# Patient Record
Sex: Male | Born: 1965 | Race: White | Hispanic: No | Marital: Married | Smoking: Never smoker
Health system: Southern US, Community
[De-identification: ages and names within clinical notes are randomized; demographics above are authoritative.]

## PROBLEM LIST (undated history)

## (undated) DIAGNOSIS — G4730 Sleep apnea, unspecified: Secondary | ICD-10-CM

## (undated) DIAGNOSIS — J45909 Unspecified asthma, uncomplicated: Secondary | ICD-10-CM

## (undated) DIAGNOSIS — N189 Chronic kidney disease, unspecified: Secondary | ICD-10-CM

## (undated) DIAGNOSIS — I10 Essential (primary) hypertension: Secondary | ICD-10-CM

## (undated) DIAGNOSIS — F419 Anxiety disorder, unspecified: Secondary | ICD-10-CM

## (undated) DIAGNOSIS — K219 Gastro-esophageal reflux disease without esophagitis: Secondary | ICD-10-CM

## (undated) DIAGNOSIS — T7840XA Allergy, unspecified, initial encounter: Secondary | ICD-10-CM

## (undated) DIAGNOSIS — M5412 Radiculopathy, cervical region: Principal | ICD-10-CM

## (undated) DIAGNOSIS — Z87442 Personal history of urinary calculi: Secondary | ICD-10-CM

## (undated) DIAGNOSIS — E119 Type 2 diabetes mellitus without complications: Secondary | ICD-10-CM

## (undated) DIAGNOSIS — F32A Depression, unspecified: Secondary | ICD-10-CM

## (undated) DIAGNOSIS — E785 Hyperlipidemia, unspecified: Secondary | ICD-10-CM

## (undated) DIAGNOSIS — F329 Major depressive disorder, single episode, unspecified: Secondary | ICD-10-CM

## (undated) DIAGNOSIS — G8929 Other chronic pain: Principal | ICD-10-CM

## (undated) HISTORY — DX: Anxiety disorder, unspecified: F41.9

## (undated) HISTORY — DX: Radiculopathy, cervical region: M54.12

## (undated) HISTORY — DX: Essential (primary) hypertension: I10

## (undated) HISTORY — DX: Allergy, unspecified, initial encounter: T78.40XA

## (undated) HISTORY — DX: Depression, unspecified: F32.A

## (undated) HISTORY — DX: Hyperlipidemia, unspecified: E78.5

## (undated) HISTORY — PX: STONE EXTRACTION WITH BASKET: SHX5318

## (undated) HISTORY — DX: Other chronic pain: G89.29

## (undated) HISTORY — DX: Chronic kidney disease, unspecified: N18.9

## (undated) HISTORY — PX: TESTICLE SURGERY: SHX794

## (undated) HISTORY — DX: Major depressive disorder, single episode, unspecified: F32.9

---

## 2011-08-28 DIAGNOSIS — R072 Precordial pain: Secondary | ICD-10-CM

## 2015-04-23 HISTORY — PX: COLONOSCOPY: SHX174

## 2015-11-22 ENCOUNTER — Emergency Department (HOSPITAL_COMMUNITY)
Admission: EM | Admit: 2015-11-22 | Discharge: 2015-11-22 | Disposition: A | Discharge status: Home / Self Care | Payer: BLUE CROSS/BLUE SHIELD | Attending: Emergency Medicine | Admitting: Emergency Medicine

## 2015-11-22 ENCOUNTER — Encounter (HOSPITAL_COMMUNITY): Payer: Self-pay | Admitting: *Deleted

## 2015-11-22 DIAGNOSIS — R109 Unspecified abdominal pain: Secondary | ICD-10-CM

## 2015-11-22 DIAGNOSIS — E119 Type 2 diabetes mellitus without complications: Secondary | ICD-10-CM | POA: Diagnosis not present

## 2015-11-22 DIAGNOSIS — N390 Urinary tract infection, site not specified: Secondary | ICD-10-CM

## 2015-11-22 DIAGNOSIS — R319 Hematuria, unspecified: Secondary | ICD-10-CM | POA: Insufficient documentation

## 2015-11-22 HISTORY — DX: Type 2 diabetes mellitus without complications: E11.9

## 2015-11-22 LAB — CBC
HEMATOCRIT: 43.9 % (ref 39.0–52.0)
HEMOGLOBIN: 15 g/dL (ref 13.0–17.0)
MCH: 29.1 pg (ref 26.0–34.0)
MCHC: 34.2 g/dL (ref 30.0–36.0)
MCV: 85.2 fL (ref 78.0–100.0)
Platelets: 251 10*3/uL (ref 150–400)
RBC: 5.15 MIL/uL (ref 4.22–5.81)
RDW: 12.7 % (ref 11.5–15.5)
WBC: 9 10*3/uL (ref 4.0–10.5)

## 2015-11-22 LAB — COMPREHENSIVE METABOLIC PANEL
ALK PHOS: 51 U/L (ref 38–126)
ALT: 81 U/L — AB (ref 17–63)
AST: 53 U/L — AB (ref 15–41)
Albumin: 4.4 g/dL (ref 3.5–5.0)
Anion gap: 10 (ref 5–15)
BILIRUBIN TOTAL: 0.6 mg/dL (ref 0.3–1.2)
BUN: 15 mg/dL (ref 6–20)
CALCIUM: 9.9 mg/dL (ref 8.9–10.3)
CO2: 23 mmol/L (ref 22–32)
CREATININE: 1.05 mg/dL (ref 0.61–1.24)
Chloride: 103 mmol/L (ref 101–111)
GFR calc Af Amer: >60 mL/min (ref >60)
GLUCOSE: 156 mg/dL — AB (ref 65–99)
POTASSIUM: 4.3 mmol/L (ref 3.5–5.1)
SODIUM: 136 mmol/L (ref 135–145)
TOTAL PROTEIN: 7.8 g/dL (ref 6.5–8.1)

## 2015-11-22 LAB — URINE MICROSCOPIC-ADD ON

## 2015-11-22 LAB — URINALYSIS, ROUTINE W REFLEX MICROSCOPIC
GLUCOSE, UA: NEGATIVE mg/dL
Ketones, ur: 15 mg/dL — AB
Nitrite: POSITIVE — AB
PROTEIN: 100 mg/dL — AB
Specific Gravity, Urine: 1.026 (ref 1.005–1.030)
pH: 5.5 (ref 5.0–8.0)

## 2015-11-22 LAB — I-STAT TROPONIN, ED: Troponin i, poc: 0.01 ng/mL (ref 0.00–0.08)

## 2015-11-22 MED ORDER — CEPHALEXIN 500 MG PO CAPS: 500.0000 mg | ORAL_CAPSULE | Freq: Four times a day (QID) | ORAL | 0 refills | Status: DC

## 2015-11-22 NOTE — ED Triage Notes (Addendum)
Pt reports having episode of abd pain this am with diaphoresis and near syncope. The pain radiated down into groin and testicle. Denies cp or sob. His abd pain was relieved pta. Hx of kidney stones.

## 2015-11-22 NOTE — ED Notes (Signed)
Patient verbalized understanding of discharge instructions and denies any further needs or questions at this time. VS stable. Patient ambulatory with steady gait.  

## 2015-11-22 NOTE — ED Notes (Signed)
Updated patient on delay - spoke with MD - to come reassess soon.

## 2015-11-22 NOTE — Discharge Instructions (Signed)
Syncope, commonly known as fainting, is a temporary loss of consciousness. It occurs when the blood flow to the brain is reduced. Vasovagal syncope (also called neurocardiogenic syncope) is a fainting spell in which the blood flow to the brain is reduced because of a sudden drop in heart rate and blood pressure. Vasovagal syncope occurs when the brain and the cardiovascular system (blood vessels) do not adequately communicate and respond to each other. This is the most common cause of fainting. It often occurs in response to fear or some other type of emotional or physical stress. The body has a reaction in which the heart starts beating too slowly or the blood vessels expand, reducing blood pressure. This type of fainting spell is generally considered harmless. However, injuries can occur if a person takes a sudden fall during a fainting spell.   CAUSES   Vasovagal syncope occurs when a person's blood pressure and heart rate decrease suddenly, usually in response to a trigger. Many things and situations can trigger an episode. Some of these include:    Pain.    Fear.    The sight of blood or medical procedures, such as blood being drawn from a vein.    Common activities, such as coughing, swallowing, stretching, or going to the bathroom.    Emotional stress.    Prolonged standing, especially in a warm environment.    Lack of sleep or rest.    Prolonged lack of food.    Prolonged lack of fluids.    Recent illness.   The use of certain drugs that affect blood pressure, such as cocaine, alcohol, marijuana, inhalants, and opiates.   SYMPTOMS   Before the fainting episode, you may:    Feel dizzy or light headed.    Become pale.   Sense that you are going to faint.    Feel like the room is spinning.    Have tunnel vision, only seeing directly in front of you.    Feel sick to your stomach (nauseous).    See spots or slowly lose vision.    Hear ringing in your ears.    Have a headache.     Feel warm and sweaty.    Feel a sensation of pins and needles.  During the fainting spell, you will generally be unconscious for no longer than a couple minutes before waking up and returning to normal. If you get up too quickly before your body can recover, you may faint again. Some twitching or jerky movements may occur during the fainting spell.   DIAGNOSIS   Your health care provider will ask about your symptoms, take a medical history, and perform a physical exam. Various tests may be done to rule out other causes of fainting. These may include blood tests and tests to check the heart, such as electrocardiography, echocardiography, and possibly an electrophysiology study. When other causes have been ruled out, a test may be done to check the body's response to changes in position (tilt table test).  TREATMENT   Most cases of vasovagal syncope do not require treatment. Your health care provider may recommend ways to avoid fainting triggers and may provide home strategies for preventing fainting. If you must be exposed to a possible trigger, you can drink additional fluids to help reduce your chances of having an episode of vasovagal syncope. If you have warning signs of an oncoming episode, you can respond by positioning yourself favorably (lying down).  If your fainting spells continue, you may be   given medicines to prevent fainting. Some medicines may help make you more resistant to repeated episodes of vasovagal syncope. Special exercises or compression stockings may be recommended. In rare cases, the surgical placement of a pacemaker is considered.  HOME CARE INSTRUCTIONS    Learn to identify the warning signs of vasovagal syncope.    Sit or lie down at the first warning sign of a fainting spell. If sitting, put your head down between your legs. If you lie down, swing your legs up in the air to increase blood flow to the brain.    Avoid hot tubs and saunas.   Avoid prolonged standing.   Drink  enough fluids to keep your urine clear or pale yellow. Avoid caffeine.   Increase salt in your diet as directed by your health care provider.    If you have to stand for a long time, perform movements such as:     Crossing your legs.     Flexing and stretching your leg muscles.     Squatting.     Moving your legs.     Bending over.    Only take over-the-counter or prescription medicines as directed by your health care provider. Do not suddenly stop any medicines without asking your health care provider first.  SEEK MEDICAL CARE IF:    Your fainting spells continue or happen more frequently in spite of treatment.    You lose consciousness for more than a couple minutes.   You have fainting spells during or after exercising or after being startled.    You have new symptoms that occur with the fainting spells, such as:     Shortness of breath.    Chest pain.     Irregular heartbeat.    You have episodes of twitching or jerky movements that last longer than a few seconds.   You have episodes of twitching or jerky movements without obvious fainting.  SEEK IMMEDIATE MEDICAL CARE IF:    You have injuries or bleeding after a fainting spell.    You have episodes of twitching or jerky movements that last longer than 5 minutes.    You have more than one spell of twitching or jerky movements before returning to consciousness after fainting.     This information is not intended to replace advice given to you by your health care provider. Make sure you discuss any questions you have with your health care provider.     Document Released: 03/25/2012 Document Revised: 08/23/2014 Document Reviewed: 03/25/2012  Elsevier Interactive Patient Education 2016 [employer]

## 2015-11-22 NOTE — ED Provider Notes (Signed)
[facility] DEPT Provider Note   CSN: [id number] Arrival date & time: 11/22/15  1544  First Provider Contact:  None       History   Chief Complaint Chief Complaint  Patient presents with  . Near Syncope    HPI Adrian Jacobson is a 50 y.o. male.with sudden onset of left flank pain this am became nausearted then light heated with diaphoresis and became lightheaded.  Recnelty told abnormalities on stresss test.  No chest pain.  Pain in left flankl 1030-2 stopped with aspirin and zantac.  Noted blood in urine after arrival here.   HPI  Past Medical History:  Diagnosis Date  . Diabetes mellitus without complication ([place])     There are no active problems to display for this patient.   History reviewed. No pertinent surgical history.     Home Medications    Prior to Admission medications   Not on File    Family History History reviewed. No pertinent family history.  Social History Social History  Substance Use Topics  . Smoking status: Never Smoker  . Smokeless tobacco: Not on file  . Alcohol use No     Allergies   Hydrocodone and Niacin and related   Review of Systems Review of Systems  All other systems reviewed and are negative.    Physical Exam Updated Vital Signs BP 115/82   Pulse 94   Temp 98.5 F (36.9 C) (Oral)   Resp 10   SpO2 98%   Physical Exam  Constitutional: He is oriented to person, place, and time. He appears well-developed.  HENT:  Head: Normocephalic and atraumatic.  Right Ear: External ear normal.  Left Ear: External ear normal.  Nose: Nose normal.  Eyes: EOM are normal.  Neck: No tracheal deviation present.  Cardiovascular: Normal rate.   Pulmonary/Chest: Effort normal and breath sounds normal.  Abdominal: Soft. Bowel sounds are normal.  Genitourinary:  Genitourinary Comments: No flank pain  Musculoskeletal: Normal range of motion.  Neurological: He is alert and oriented to person, place, and time.  Skin: Skin is  warm and dry.  Psychiatric: He has a normal mood and affect. His behavior is normal.  Nursing note and vitals reviewed.    ED Treatments / Results  Labs (all labs ordered are listed, but only abnormal results are displayed) Labs Reviewed  COMPREHENSIVE METABOLIC PANEL - Abnormal; Notable for the following:       Result Value   Glucose, Bld 156 (*)    AST 53 (*)    ALT 81 (*)    All other components within normal limits  URINALYSIS, ROUTINE W REFLEX MICROSCOPIC (NOT AT [facility]) - Abnormal; Notable for the following:    Color, Urine RED (*)    APPearance TURBID (*)    Hgb urine dipstick LARGE (*)    Bilirubin Urine LARGE (*)    Ketones, ur 15 (*)    Protein, ur 100 (*)    Nitrite POSITIVE (*)    Leukocytes, UA MODERATE (*)    All other components within normal limits  URINE MICROSCOPIC-ADD ON - Abnormal; Notable for the following:    Squamous Epithelial / LPF 0-5 (*)    Bacteria, UA MANY (*)    All other components within normal limits  CBC  I-STAT TROPOININ, ED    EKG  EKG Interpretation  Date/Time:  Wednesday November 22 2015 16:05:38 EDT Ventricular Rate:  91 PR Interval:  144 QRS Duration: 96 QT Interval:  346 QTC Calculation: 425 R  Axis:   49 Text Interpretation:  Normal sinus rhythm with sinus arrhythmia Normal ECG Confirmed by Kongmeng Santoro MD, Andee Poles [phone]) on 11/22/2015 8:33:19 PM       Radiology No results found.  Procedures Procedures (including critical care time)  Medications Ordered in ED Medications - No data to display   Initial Impression / Assessment and Plan / ED Course  I have reviewed the triage vital signs and the nursing notes.  Pertinent labs & imaging results that were available during my care of the patient were reviewed by me and considered in my medical decision making (see chart for details).  Clinical Course    Patient had left flank pain followed by near syncope after the onset of pain. He has a history of kidney stones and evaluation  here is consistent with kidney stones with hematuria noted. He also has moderate leukocytes and is nitrite positive and has many bacteria. He is treated here with Keflex. He is pain-free. He has had recent evaluation for chest pain and has follow-up arranged with cardiology. EKG here is normal and history is consistent with vagal episode. Patient advised regarding need for follow-up and return precautions and voices understanding. Final Clinical Impressions(s) / ED Diagnoses   Final diagnoses:  Left flank pain  Hematuria  UTI (lower urinary tract infection)    New Prescriptions New Prescriptions   No medications on file     Pattricia Boss, MD 11/23/15 0010

## 2015-11-24 ENCOUNTER — Encounter (HOSPITAL_COMMUNITY): Payer: Self-pay | Admitting: Emergency Medicine

## 2015-11-24 ENCOUNTER — Emergency Department (HOSPITAL_COMMUNITY)
Admission: EM | Admit: 2015-11-24 | Discharge: 2015-11-24 | Disposition: A | Discharge status: Home / Self Care | Payer: BLUE CROSS/BLUE SHIELD | Attending: Emergency Medicine | Admitting: Emergency Medicine

## 2015-11-24 ENCOUNTER — Emergency Department (HOSPITAL_COMMUNITY): Payer: BLUE CROSS/BLUE SHIELD

## 2015-11-24 DIAGNOSIS — R1032 Left lower quadrant pain: Secondary | ICD-10-CM | POA: Diagnosis present

## 2015-11-24 DIAGNOSIS — N201 Calculus of ureter: Secondary | ICD-10-CM

## 2015-11-24 DIAGNOSIS — Z7984 Long term (current) use of oral hypoglycemic drugs: Secondary | ICD-10-CM | POA: Insufficient documentation

## 2015-11-24 DIAGNOSIS — Z79899 Other long term (current) drug therapy: Secondary | ICD-10-CM | POA: Diagnosis not present

## 2015-11-24 DIAGNOSIS — E119 Type 2 diabetes mellitus without complications: Secondary | ICD-10-CM | POA: Diagnosis not present

## 2015-11-24 LAB — URINALYSIS, ROUTINE W REFLEX MICROSCOPIC
BILIRUBIN URINE: NEGATIVE
GLUCOSE, UA: NEGATIVE mg/dL
Ketones, ur: NEGATIVE mg/dL
Leukocytes, UA: NEGATIVE
NITRITE: NEGATIVE
PH: 5.5 (ref 5.0–8.0)
Protein, ur: 30 mg/dL — AB

## 2015-11-24 LAB — URINE MICROSCOPIC-ADD ON
Squamous Epithelial / LPF: NONE SEEN
WBC UA: NONE SEEN WBC/hpf (ref 0–5)

## 2015-11-24 LAB — COMPREHENSIVE METABOLIC PANEL
ALBUMIN: 4.3 g/dL (ref 3.5–5.0)
ALT: 73 U/L — ABNORMAL HIGH (ref 17–63)
ANION GAP: 8 (ref 5–15)
AST: 45 U/L — AB (ref 15–41)
Alkaline Phosphatase: 48 U/L (ref 38–126)
BILIRUBIN TOTAL: 0.5 mg/dL (ref 0.3–1.2)
BUN: 18 mg/dL (ref 6–20)
CHLORIDE: 103 mmol/L (ref 101–111)
CO2: 26 mmol/L (ref 22–32)
Calcium: 8.9 mg/dL (ref 8.9–10.3)
Creatinine, Ser: 1.09 mg/dL (ref 0.61–1.24)
GFR calc Af Amer: >60 mL/min (ref >60)
GFR calc non Af Amer: >60 mL/min (ref >60)
GLUCOSE: 157 mg/dL — AB (ref 65–99)
POTASSIUM: 4.1 mmol/L (ref 3.5–5.1)
SODIUM: 137 mmol/L (ref 135–145)
TOTAL PROTEIN: 7.8 g/dL (ref 6.5–8.1)

## 2015-11-24 LAB — CBC
HEMATOCRIT: 43 % (ref 39.0–52.0)
HEMOGLOBIN: 14.7 g/dL (ref 13.0–17.0)
MCH: 29.2 pg (ref 26.0–34.0)
MCHC: 34.2 g/dL (ref 30.0–36.0)
MCV: 85.3 fL (ref 78.0–100.0)
Platelets: 210 10*3/uL (ref 150–400)
RBC: 5.04 MIL/uL (ref 4.22–5.81)
RDW: 13 % (ref 11.5–15.5)
WBC: 7.9 10*3/uL (ref 4.0–10.5)

## 2015-11-24 MED ORDER — SODIUM CHLORIDE 0.9 % IV SOLN
INTRAVENOUS | Status: DC
  Administered 2015-11-24: 14:00:00 via INTRAVENOUS

## 2015-11-24 MED ORDER — NAPROXEN 500 MG PO TABS: 500.0000 mg | ORAL_TABLET | Freq: Two times a day (BID) | ORAL | 0 refills | Status: DC

## 2015-11-24 MED ORDER — PROMETHAZINE HCL 25 MG PO TABS: 25.0000 mg | ORAL_TABLET | Freq: Four times a day (QID) | ORAL | 1 refills | Status: DC | PRN

## 2015-11-24 MED ORDER — HYDROMORPHONE HCL 1 MG/ML IJ SOLN
1.0000 mg | Freq: Once | INTRAMUSCULAR | Status: AC
  Administered 2015-11-24: 1 mg via INTRAVENOUS
  Filled 2015-11-24: qty 1

## 2015-11-24 MED ORDER — KETOROLAC TROMETHAMINE 30 MG/ML IJ SOLN
30.0000 mg | Freq: Once | INTRAMUSCULAR | Status: AC
  Administered 2015-11-24: 30 mg via INTRAVENOUS
  Filled 2015-11-24: qty 1

## 2015-11-24 MED ORDER — HYDROMORPHONE HCL 4 MG PO TABS: 4.0000 mg | ORAL_TABLET | Freq: Four times a day (QID) | ORAL | 0 refills | Status: DC | PRN

## 2015-11-24 MED ORDER — SODIUM CHLORIDE 0.9 % IV BOLUS (SEPSIS)
1000.0000 mL | Freq: Once | INTRAVENOUS | Status: AC
  Administered 2015-11-24: 1000 mL via INTRAVENOUS

## 2015-11-24 MED ORDER — ONDANSETRON HCL 4 MG/2ML IJ SOLN
4.0000 mg | Freq: Once | INTRAMUSCULAR | Status: AC
  Administered 2015-11-24: 4 mg via INTRAVENOUS
  Filled 2015-11-24: qty 2

## 2015-11-24 NOTE — ED Provider Notes (Signed)
[facility] DEPT Provider Note   CSN: [id number] Arrival date & time: 11/24/15  1039  First Provider Contact:  First MD Initiated Contact with Patient 11/24/15 1132   By signing my name below, I, Georgette Shell, attest that this documentation has been prepared under the direction and in the presence of Fredia Sorrow, MD. Electronically Signed: Georgette Shell, ED Scribe. 11/24/15. 12:02 PM.  History   Chief Complaint Chief Complaint  Patient presents with  . Flank Pain   HPI Comments: Adrian Jacobson is a 50 y.o. male with h/o DM and renal disorder who presents to the Emergency Department complaining of sudden onset, constant, stabbing, 9/10 LLQ abdominal pain onset this morning at 9:30 am. Pt also has associated flank pain, back pain, diaphoresis, nausea, hematuria, and fever. Pt was seen at [facility] ED 2 days ago for the same symptoms and had an EKG done which was unremarkable. He was given Keflex which resolved the pain and subsequently discharged. Pt has h/o kidney stones and notes these symptoms are similar. Pt has had these symptoms intermittently for two weeks. Pt has had a stress test done recently. He has an appointment with his cardiologist on 10/24/2015. Pt is on Aspirin. Pt denies congestion, rhinorrhea, sore throat, visual disturbance, cough, shortness of breath, chest pain, leg swelling, diarrhea, vomiting, dysuria, neck pain, rash, headache, and bruising easily.  The history is provided by the patient. No language interpreter was used.    Past Medical History:  Diagnosis Date  . Diabetes mellitus without complication ([place])   . Renal disorder    kidney stones    There are no active problems to display for this patient.   Past Surgical History:  Procedure Laterality Date  . STONE EXTRACTION WITH BASKET    . TESTICLE SURGERY         Home Medications    Prior to Admission medications   Medication Sig Start Date End Date Taking? Authorizing Provider  aspirin 81 MG tablet Take  81 mg by mouth every evening.    Yes Historical Provider, MD  cephALEXin (KEFLEX) 500 MG capsule Take 1 capsule (500 mg total) by mouth 4 (four) times daily. 11/22/15  Yes Pattricia Boss, MD  glipiZIDE (GLUCOTROL XL) 2.5 MG 24 hr tablet Take 2.5 mg by mouth every evening.    Yes Historical Provider, MD  metFORMIN (GLUCOPHAGE-XR) 500 MG 24 hr tablet Take 2 tablets by mouth 2 (two) times daily. 10/25/15  Yes Historical Provider, MD  Multiple Vitamin (MULTIVITAMIN WITH MINERALS) TABS tablet Take 1 tablet by mouth every evening.   Yes Historical Provider, MD  ranitidine (ZANTAC) 150 MG tablet Take 150 mg by mouth 2 (two) times daily.   Yes Historical Provider, MD  sucralfate (CARAFATE) 1 g tablet Take 1 tablet by mouth daily. 11/13/15  Yes Historical Provider, MD  HYDROmorphone (DILAUDID) 4 MG tablet Take 1 tablet (4 mg total) by mouth every 6 (six) hours as needed for severe pain. 11/24/15   Fredia Sorrow, MD  naproxen (NAPROSYN) 500 MG tablet Take 1 tablet (500 mg total) by mouth 2 (two) times daily. 11/24/15   Fredia Sorrow, MD  promethazine (PHENERGAN) 25 MG tablet Take 1 tablet (25 mg total) by mouth every 6 (six) hours as needed. 11/24/15   Fredia Sorrow, MD    Family History History reviewed. No pertinent family history.  Social History Social History  Substance Use Topics  . Smoking status: Never Smoker  . Smokeless tobacco: Not on file  . Alcohol use No  Allergies   Hydrocodone; Niacin and related; and Strawberry (diagnostic) Review of Systems Review of Systems  Constitutional: Positive for diaphoresis and fever.  HENT: Negative for congestion, rhinorrhea and sore throat.   Eyes: Negative for visual disturbance.  Respiratory: Negative for cough and shortness of breath.   Cardiovascular: Negative for chest pain and leg swelling.  Gastrointestinal: Positive for abdominal pain and nausea. Negative for diarrhea and vomiting.  Genitourinary: Positive for hematuria. Negative for dysuria.   Musculoskeletal: Positive for back pain. Negative for neck pain.  Skin: Negative for rash.  Neurological: Negative for headaches.  Hematological: Does not bruise/bleed easily.  Psychiatric/Behavioral: Negative for confusion.     Physical Exam Updated Vital Signs BP 138/97 (BP Location: Right Arm)   Pulse 63   Temp 97.6 F (36.4 C) (Oral)   Resp 18   Ht 5\' 11"  (1.803 m)   Wt 108.9 kg   SpO2 94%   BMI 33.47 kg/m   Physical Exam  Constitutional: He is oriented to person, place, and time. He appears well-developed and well-nourished.  HENT:  Head: Normocephalic.  Moist mucous membranes.  Eyes: Conjunctivae and EOM are normal. Pupils are equal, round, and reactive to light. No scleral icterus.  Cardiovascular: Normal rate, regular rhythm and normal heart sounds.  Exam reveals no gallop and no friction rub.   No murmur heard. Pulmonary/Chest: Effort normal and breath sounds normal. No respiratory distress. He has no wheezes. He has no rales.  Abdominal: Soft. Bowel sounds are normal. He exhibits no distension. There is no tenderness.  Musculoskeletal: Normal range of motion. He exhibits no edema.  Neurological: He is alert and oriented to person, place, and time. No cranial nerve deficit. Coordination normal.  Skin: Skin is warm and dry.  Psychiatric: He has a normal mood and affect. His behavior is normal.  Nursing note and vitals reviewed.   ED Treatments / Results  DIAGNOSTIC STUDIES: Oxygen Saturation is 99% on RA, normal by my interpretation.    COORDINATION OF CARE: 11:47 AM Discussed treatment plan with pt at bedside which includes CT scan and pt agreed to plan.  Labs (all labs ordered are listed, but only abnormal results are displayed) Labs Reviewed  COMPREHENSIVE METABOLIC PANEL - Abnormal; Notable for the following:       Result Value   Glucose, Bld 157 (*)    AST 45 (*)    ALT 73 (*)    All other components within normal limits  URINALYSIS, ROUTINE W  REFLEX MICROSCOPIC (NOT AT [facility]) - Abnormal; Notable for the following:    APPearance HAZY (*)    Specific Gravity, Urine >1.030 (*)    Hgb urine dipstick LARGE (*)    Protein, ur 30 (*)    All other components within normal limits  URINE MICROSCOPIC-ADD ON - Abnormal; Notable for the following:    Bacteria, UA RARE (*)    All other components within normal limits  CBC    EKG  EKG Interpretation None       Radiology Ct Renal Stone Study  Result Date: 11/24/2015 CLINICAL DATA:  One day history of left lower quadrant region pain EXAM: CT ABDOMEN AND PELVIS WITHOUT CONTRAST TECHNIQUE: Multidetector CT imaging of the abdomen and pelvis was performed following the standard protocol without oral or intravenous contrast material administration. COMPARISON:  None. FINDINGS: Lower chest:  Lung bases are clear. Hepatobiliary: No focal liver lesions are evident on this noncontrast enhanced study. Gallbladder wall is not appreciably thickened. There is  no biliary duct dilatation. Pancreas: There is no pancreatic mass or inflammatory focus. Spleen: No splenic lesions are evident. Adrenals/Urinary Tract: Adrenals appear normal bilaterally. There is no renal mass on either side. There is moderate hydronephrosis on the left. There is no hydronephrosis on the right. There is a calculus in the lower pole left kidney measuring 6 x 2 mm. There is a 1 mm calculus mid left kidney. On the right, there is a 2 mm calculus with a nearby 4 mm calculus in the mid to lower pole region. On the right, there is no ureteral calculus. On the left, there is a calculus in the left ureter at the level of L3 measuring 6 x 5 x 4 mm. No other ureteral calculus is identified on the left. Urinary bladder is midline with wall thickness within normal limits. Stomach/Bowel: There are occasional sigmoid diverticula without diverticulitis. There is no bowel wall or mesenteric thickening. There is no bowel obstruction. No free air or portal  venous air. Vascular/Lymphatic: There is no abdominal aortic aneurysm. There is calcification in the right common iliac artery. There is no appreciable vessel narrowing involving the major mesenteric arterial vessels on this noncontrast enhanced study. There is no adenopathy in the abdomen or pelvis. Reproductive: Prostate and seminal vesicles appear normal. There is no pelvic mass or pelvic fluid collection. Other: There is no periappendiceal region inflammation. There is no abscess or ascites in the abdomen or pelvis. There is fat in the left inguinal ring. Musculoskeletal: There are no blastic or lytic bone lesions. There is no intramuscular or abdominal wall lesion. IMPRESSION: Moderate hydronephrosis on the left. 6 x 5 x 4 mm calculus at the level of L3 in the proximal left ureter. No other ureteral calculi evident. There are nonobstructing calculi in each kidney. No bowel obstruction. No abscess. Periappendiceal region appears unremarkable. There are occasional sigmoid diverticula without diverticulitis. Electronically Signed   By: Lowella Grip III M.D.   On: 11/24/2015 13:09    Procedures Procedures (including critical care time)  Medications Ordered in ED Medications  0.9 %  sodium chloride infusion ( Intravenous New Bag/Given 11/24/15 1331)  sodium chloride 0.9 % bolus 1,000 mL (0 mLs Intravenous Stopped 11/24/15 1331)  ondansetron (ZOFRAN) injection 4 mg (4 mg Intravenous Given 11/24/15 1233)  HYDROmorphone (DILAUDID) injection 1 mg (1 mg Intravenous Given 11/24/15 1234)  HYDROmorphone (DILAUDID) injection 1 mg (1 mg Intravenous Given 11/24/15 1326)  ketorolac (TORADOL) 30 MG/ML injection 30 mg (30 mg Intravenous Given 11/24/15 1346)     Initial Impression / Assessment and Plan / ED Course  I have reviewed the triage vital signs and the nursing notes.  Pertinent labs & imaging results that were available during my care of the patient were reviewed by me and considered in my medical decision  making (see chart for details).  Clinical Course    Patient with left-sided ureteral stone measuring 6 x 5 mm in size. Patient without any significant renal compromise based on the labs. Patient will need follow-up with urology. Patient with improved pain control here. No evidence of any urinary tract infection at this time.  Final Clinical Impressions(s) / ED Diagnoses   Final diagnoses:  Left ureteral stone    New Prescriptions New Prescriptions   HYDROMORPHONE (DILAUDID) 4 MG TABLET    Take 1 tablet (4 mg total) by mouth every 6 (six) hours as needed for severe pain.   NAPROXEN (NAPROSYN) 500 MG TABLET    Take 1 tablet (500  mg total) by mouth 2 (two) times daily.   PROMETHAZINE (PHENERGAN) 25 MG TABLET    Take 1 tablet (25 mg total) by mouth every 6 (six) hours as needed.  I personally performed the services described in this documentation, which was scribed in my presence. The recorded information has been reviewed and is accurate.       Fredia Sorrow, MD 11/24/15 [phone]

## 2015-11-24 NOTE — Discharge Instructions (Signed)
Call urology for follow-up. Take the Naprosyn on a regular basis supplement with the hydromorphone pain medicine as needed and the Phenergan for nausea and vomiting. Return for any new or worse symptoms. Work note provided.

## 2015-11-24 NOTE — ED Triage Notes (Signed)
Pt reports left flank pain beginning this morning.  Had same pain a few days ago and was seen at [facility].

## 2015-11-26 NOTE — Progress Notes (Signed)
Cardiology Office Note   Date:  11/27/2015   ID:  Adrian Jacobson, DOB 1965/06/16, MRN [id number]  PCP:  Celedonio Savage, MD  Cardiologist:   Jenkins Rouge, MD   Chief Complaint  Patient presents with  . Establish Care    pain in shoulder, chills, went to ED, referred from there to [place], per pt      History of Present Illness: Adrian Jacobson is a 50 y.o. male who presents for evaluation of abnormal stress test. CRF;s DM. Of note recent ER visit for left uretal stone. Has history of kidney stones Also noted mild elevation in LFTls during ER visit  Admitted in [facility] 7/23-7/24 Abdominal and right shoulder pain. Seen by Dr Wenda Overland HIDA scan ordered ? Results  Report indicated large area of moderate reversibility in inferior wall EF 55%   Has had occasional SSCP had ETT normal 5 years ago and thought due to niacin Left sided pain sharp radiates to left arm. Some diaphoresis. 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   Waiting to see urology Dr Edwena Blow for stones.     Past Medical History:  Diagnosis Date  . Diabetes mellitus without complication ([place])   . Renal disorder    kidney stones    Past Surgical History:  Procedure Laterality Date  . STONE EXTRACTION WITH BASKET    . TESTICLE SURGERY       Current Outpatient Prescriptions  Medication Sig Dispense Refill  . aspirin 81 MG tablet Take 81 mg by mouth every evening.     . cephALEXin (KEFLEX) 500 MG capsule Take 1 capsule (500 mg total) by mouth 4 (four) times daily. 20 capsule 0  . glipiZIDE (GLUCOTROL XL) 2.5 MG 24 hr tablet Take 2.5 mg by mouth every evening.     Marland Kitchen HYDROmorphone (DILAUDID) 4 MG tablet Take 1 tablet (4 mg total) by mouth every 6 (six) hours as needed for severe pain. 20 tablet 0  . metFORMIN (GLUCOPHAGE-XR) 500 MG 24 hr tablet Take 2 tablets by mouth 2 (two) times daily.    . Multiple Vitamin (MULTIVITAMIN WITH MINERALS) TABS tablet Take 1 tablet by mouth every evening.    . naproxen (NAPROSYN) 500 MG tablet  Take 1 tablet (500 mg total) by mouth 2 (two) times daily. 14 tablet 0  . promethazine (PHENERGAN) 25 MG tablet Take 1 tablet (25 mg total) by mouth every 6 (six) hours as needed. 12 tablet 1  . ranitidine (ZANTAC) 150 MG tablet Take 150 mg by mouth 2 (two) times daily.    . sucralfate (CARAFATE) 1 g tablet Take 1 tablet by mouth daily.     No current facility-administered medications for this visit.     Allergies:   Strawberry (diagnostic); Hydrocodone; and Niacin and related    Social History:  The patient  reports that he has never smoked. He has never used smokeless tobacco. He reports that he does not drink alcohol or use drugs.   Family History:  The patient's family history is not on file.    ROS:  Please see the history of present illness.   Otherwise, review of systems are positive for none.   All other systems are reviewed and negative.    PHYSICAL EXAM: VS:  BP 116/88 (BP Location: Right Arm, Patient Position: Sitting, Cuff Size: Normal)   Pulse 79   Ht 5\' 11"  (1.803 m)   Wt 248 lb (112.5 kg)   SpO2 98%   BMI 34.59 kg/m  ,  BMI Body mass index is 34.59 kg/m. Affect appropriate Healthy:  appears stated age 72: normal Neck supple with no adenopathy JVP normal no bruits no thyromegaly Lungs clear with no wheezing and good diaphragmatic motion Heart:  S1/S2 no murmur, no rub, gallop or click PMI normal Abdomen: benighn, BS positve, no tenderness, no AAA no bruit.  No HSM or HJR Distal pulses intact with no bruits No edema Neuro non-focal Skin warm and dry No muscular weakness    EKG:  11/24/15  SR rate 91 normal ECG    Recent Labs: 11/24/2015: ALT 73; BUN 18; Creatinine, Ser 1.09; Hemoglobin 14.7; Platelets 210; Potassium 4.1; Sodium 137    Lipid Panel No results found for: CHOL, TRIG, HDL, CHOLHDL, VLDL, LDLCALC, LDLDIRECT    Wt Readings from Last 3 Encounters:  11/27/15 248 lb (112.5 kg)  11/24/15 240 lb (108.9 kg)      Other studies  Reviewed: Additional studies/ records that were reviewed today include: Notes [address] [facility] Wenda Overland, [facility] admit for kidney stone ER notes .    ASSESSMENT AND PLAN:  1.  Abnormal Stress TEst  Diabetic with some SSCP likely related to abdominal Process but need to r/o CAD Discussed cath including risks of stroke, MI, dye reaction Bleeding and need for immediate surgery . Willing to proceed with Dr Tamala Julian 8/9 at 8:30 Lab called orders written  2. Kidney stones urine seems to have cleared had stones at age 68 f/u Otelin will  Clear cardiac status in case lithotripsy or other Rx needed  3. DM Discussed low carb diet.  Target hemoglobin A1c is 6.5 or less.  Continue current medications. Hold glucophage 48 hrs before case   Current medicines are reviewed at length with the patient today.  The patient does not have concerns regarding medicines.  The following changes have been made:  no change  Labs/ tests ordered today include: Prec cath   Orders Placed This Encounter  Procedures  . Basic metabolic panel  . CBC w/Diff  . INR/PT     Disposition:   FU with APP post cath      Signed, Jenkins Rouge, MD  11/27/2015 11:30 AM    [facility] [address], [place], [place]  [address] Phone: [phone]; Fax: [phone]

## 2015-11-27 ENCOUNTER — Encounter: Payer: Self-pay | Admitting: Cardiovascular Disease

## 2015-11-27 ENCOUNTER — Ambulatory Visit (INDEPENDENT_AMBULATORY_CARE_PROVIDER_SITE_OTHER): Payer: BLUE CROSS/BLUE SHIELD | Admitting: Cardiovascular Disease

## 2015-11-27 ENCOUNTER — Other Ambulatory Visit: Payer: Self-pay | Admitting: Cardiovascular Disease

## 2015-11-27 VITALS — BP 116/88 | HR 79 | Ht 71.0 in | Wt 248.0 lb

## 2015-11-27 DIAGNOSIS — R931 Abnormal findings on diagnostic imaging of heart and coronary circulation: Secondary | ICD-10-CM | POA: Diagnosis not present

## 2015-11-27 DIAGNOSIS — R9439 Abnormal result of other cardiovascular function study: Secondary | ICD-10-CM

## 2015-11-27 DIAGNOSIS — Z7189 Other specified counseling: Secondary | ICD-10-CM

## 2015-11-27 DIAGNOSIS — R079 Chest pain, unspecified: Secondary | ICD-10-CM | POA: Diagnosis not present

## 2015-11-27 DIAGNOSIS — R072 Precordial pain: Secondary | ICD-10-CM

## 2015-11-27 DIAGNOSIS — Z7689 Persons encountering health services in other specified circumstances: Secondary | ICD-10-CM

## 2015-11-27 LAB — CBC WITH DIFFERENTIAL/PLATELET
BASOS PCT: 1 %
Basophils Absolute: 76 cells/uL (ref 0–200)
EOS ABS: 608 {cells}/uL — AB (ref 15–500)
Eosinophils Relative: 8 %
HEMATOCRIT: 40.2 % (ref 38.5–50.0)
HEMOGLOBIN: 13.8 g/dL (ref 13.2–17.1)
Lymphocytes Relative: 18 %
Lymphs Abs: 1368 cells/uL (ref 850–3900)
MCH: 29 pg (ref 27.0–33.0)
MCHC: 34.3 g/dL (ref 32.0–36.0)
MCV: 84.5 fL (ref 80.0–100.0)
MONO ABS: 608 {cells}/uL (ref 200–950)
MPV: 10.1 fL (ref 7.5–12.5)
Monocytes Relative: 8 %
NEUTROS ABS: 4940 {cells}/uL (ref 1500–7800)
Neutrophils Relative %: 65 %
Platelets: 206 10*3/uL (ref 140–400)
RBC: 4.76 MIL/uL (ref 4.20–5.80)
RDW: 12.9 % (ref 11.0–15.0)
WBC: 7.6 10*3/uL (ref 3.8–10.8)

## 2015-11-27 LAB — BASIC METABOLIC PANEL
BUN: 22 mg/dL (ref 7–25)
CHLORIDE: 101 mmol/L (ref 98–110)
CO2: 26 mmol/L (ref 20–31)
Calcium: 9.2 mg/dL (ref 8.6–10.3)
Creat: 1.69 mg/dL — ABNORMAL HIGH (ref 0.60–1.35)
Glucose, Bld: 138 mg/dL — ABNORMAL HIGH (ref 65–99)
POTASSIUM: 5 mmol/L (ref 3.5–5.3)
Sodium: 137 mmol/L (ref 135–146)

## 2015-11-27 LAB — PROTIME-INR
INR: 1
Prothrombin Time: 10.6 s (ref 9.0–11.5)

## 2015-11-27 NOTE — Patient Instructions (Signed)
Medication Instructions:  Your physician recommends that you continue on your current medications as directed. Please refer to the Current Medication list given to you today.   Labwork: Bmet, Cbc, Pt/Inr today  Testing/Procedures: Your physician has requested that you have a cardiac catheterization. Cardiac catheterization is used to diagnose and/or treat various heart conditions. Doctors may recommend this procedure for a number of different reasons. The most common reason is to evaluate chest pain. Chest pain can be a symptom of coronary artery disease (CAD), and cardiac catheterization can show whether plaque is narrowing or blocking your heart's arteries. This procedure is also used to evaluate the valves, as well as measure the blood flow and oxygen levels in different parts of your heart. For further information please visit [email or web address]. Please follow instruction sheet, as given.    Follow-Up: Your physician recommends that you schedule a follow-up appointment in: 2-3 weeks with an APP   Any Other Special Instructions Will Be Listed Below (If Applicable).     If you need a refill on your cardiac medications before your next appointment, please call your pharmacy.

## 2015-11-28 DIAGNOSIS — R9439 Abnormal result of other cardiovascular function study: Secondary | ICD-10-CM

## 2015-11-28 DIAGNOSIS — R079 Chest pain, unspecified: Secondary | ICD-10-CM | POA: Diagnosis present

## 2015-11-28 NOTE — H&P (View-Only) (Signed)
Cardiology Office Note   Date:  11/27/2015   ID:  Javiel Clift, DOB 12-May-1965, MRN [id number]  PCP:  Celedonio Savage, MD  Cardiologist:   Jenkins Rouge, MD   Chief Complaint  Patient presents with  . Establish Care    pain in shoulder, chills, went to ED, referred from there to [place], per pt      History of Present Illness: De [place] is a 50 y.o. male who presents for evaluation of abnormal stress test. CRF;s DM. Of note recent ER visit for left uretal stone. Has history of kidney stones Also noted mild elevation in LFTls during ER visit  Admitted in [facility] 7/23-7/24 Abdominal and right shoulder pain. Seen by Dr Wenda Overland HIDA scan ordered ? Results  Report indicated large area of moderate reversibility in inferior wall EF 55%   Has had occasional SSCP had ETT normal 5 years ago and thought due to niacin Left sided pain sharp radiates to left arm. Some diaphoresis. 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   Waiting to see urology Dr Edwena Blow for stones.     Past Medical History:  Diagnosis Date  . Diabetes mellitus without complication ([place])   . Renal disorder    kidney stones    Past Surgical History:  Procedure Laterality Date  . STONE EXTRACTION WITH BASKET    . TESTICLE SURGERY       Current Outpatient Prescriptions  Medication Sig Dispense Refill  . aspirin 81 MG tablet Take 81 mg by mouth every evening.     . cephALEXin (KEFLEX) 500 MG capsule Take 1 capsule (500 mg total) by mouth 4 (four) times daily. 20 capsule 0  . glipiZIDE (GLUCOTROL XL) 2.5 MG 24 hr tablet Take 2.5 mg by mouth every evening.     Marland Kitchen HYDROmorphone (DILAUDID) 4 MG tablet Take 1 tablet (4 mg total) by mouth every 6 (six) hours as needed for severe pain. 20 tablet 0  . metFORMIN (GLUCOPHAGE-XR) 500 MG 24 hr tablet Take 2 tablets by mouth 2 (two) times daily.    . Multiple Vitamin (MULTIVITAMIN WITH MINERALS) TABS tablet Take 1 tablet by mouth every evening.    . naproxen (NAPROSYN) 500 MG tablet  Take 1 tablet (500 mg total) by mouth 2 (two) times daily. 14 tablet 0  . promethazine (PHENERGAN) 25 MG tablet Take 1 tablet (25 mg total) by mouth every 6 (six) hours as needed. 12 tablet 1  . ranitidine (ZANTAC) 150 MG tablet Take 150 mg by mouth 2 (two) times daily.    . sucralfate (CARAFATE) 1 g tablet Take 1 tablet by mouth daily.     No current facility-administered medications for this visit.     Allergies:   Strawberry (diagnostic); Hydrocodone; and Niacin and related    Social History:  The patient  reports that he has never smoked. He has never used smokeless tobacco. He reports that he does not drink alcohol or use drugs.   Family History:  The patient's family history is not on file.    ROS:  Please see the history of present illness.   Otherwise, review of systems are positive for none.   All other systems are reviewed and negative.    PHYSICAL EXAM: VS:  BP 116/88 (BP Location: Right Arm, Patient Position: Sitting, Cuff Size: Normal)   Pulse 79   Ht 5\' 11"  (1.803 m)   Wt 248 lb (112.5 kg)   SpO2 98%   BMI 34.59 kg/m  ,  BMI Body mass index is 34.59 kg/m. Affect appropriate Healthy:  appears stated age 77: normal Neck supple with no adenopathy JVP normal no bruits no thyromegaly Lungs clear with no wheezing and good diaphragmatic motion Heart:  S1/S2 no murmur, no rub, gallop or click PMI normal Abdomen: benighn, BS positve, no tenderness, no AAA no bruit.  No HSM or HJR Distal pulses intact with no bruits No edema Neuro non-focal Skin warm and dry No muscular weakness    EKG:  11/24/15  SR rate 91 normal ECG    Recent Labs: 11/24/2015: ALT 73; BUN 18; Creatinine, Ser 1.09; Hemoglobin 14.7; Platelets 210; Potassium 4.1; Sodium 137    Lipid Panel No results found for: CHOL, TRIG, HDL, CHOLHDL, VLDL, LDLCALC, LDLDIRECT    Wt Readings from Last 3 Encounters:  11/27/15 248 lb (112.5 kg)  11/24/15 240 lb (108.9 kg)      Other studies  Reviewed: Additional studies/ records that were reviewed today include: Notes [place] [facility] Wenda Overland, [place] admit for kidney stone ER notes .    ASSESSMENT AND PLAN:  1.  Abnormal Stress TEst  Diabetic with some SSCP likely related to abdominal Process but need to r/o CAD Discussed cath including risks of stroke, MI, dye reaction Bleeding and need for immediate surgery . Willing to proceed with Dr Tamala Julian 8/9 at 8:30 Lab called orders written  2. Kidney stones urine seems to have cleared had stones at age 40 f/u Otelin will  Clear cardiac status in case lithotripsy or other Rx needed  3. DM Discussed low carb diet.  Target hemoglobin A1c is 6.5 or less.  Continue current medications. Hold glucophage 48 hrs before case   Current medicines are reviewed at length with the patient today.  The patient does not have concerns regarding medicines.  The following changes have been made:  no change  Labs/ tests ordered today include: Prec cath   Orders Placed This Encounter  Procedures  . Basic metabolic panel  . CBC w/Diff  . INR/PT     Disposition:   FU with APP post cath      Signed, Jenkins Rouge, MD  11/27/2015 11:30 AM    [facility] [place], [place], [place]  [address] Phone: [phone]; Fax: [phone]

## 2015-11-28 NOTE — Interval H&P Note (Signed)
Cath Lab Visit (complete for each Cath Lab visit)  Clinical Evaluation Leading to the Procedure:   ACS: No.  Non-ACS:    Anginal Classification: CCS Jacobson  Anti-ischemic medical therapy: Maximal Therapy (2 or more classes of medications)  Non-Invasive Test Results: Intermediate-risk stress test findings: cardiac mortality 1-3%/year  Prior CABG: No previous CABG      History and Physical Interval Note:  11/28/2015 9:39 PM  Adrian Jacobson  has presented today for surgery, with the diagnosis of adnormal stress test  The various methods of treatment have been discussed with the patient and family. After consideration of risks, benefits and other options for treatment, the patient has consented to  Procedure(s): Left Heart Cath and Coronary Angiography (N/A) as a surgical intervention .  The patient's history has been reviewed, patient examined, no change in status, stable for surgery.  I have reviewed the patient's chart and labs.  Questions were answered to the patient's satisfaction.     Adrian Jacobson

## 2015-11-29 ENCOUNTER — Encounter (HOSPITAL_COMMUNITY): Payer: Self-pay | Admitting: Interventional Cardiology

## 2015-11-29 ENCOUNTER — Ambulatory Visit (HOSPITAL_COMMUNITY)
Admission: RE | Admit: 2015-11-29 | Discharge: 2015-11-29 | Disposition: A | Discharge status: Home / Self Care | Payer: BLUE CROSS/BLUE SHIELD | Source: Ambulatory Visit | Attending: Interventional Cardiology | Admitting: Interventional Cardiology

## 2015-11-29 ENCOUNTER — Encounter (HOSPITAL_COMMUNITY)
Admission: RE | Disposition: A | Discharge status: Home / Self Care | Payer: Self-pay | Source: Ambulatory Visit | Attending: Interventional Cardiology

## 2015-11-29 DIAGNOSIS — R9439 Abnormal result of other cardiovascular function study: Secondary | ICD-10-CM

## 2015-11-29 DIAGNOSIS — R931 Abnormal findings on diagnostic imaging of heart and coronary circulation: Secondary | ICD-10-CM

## 2015-11-29 DIAGNOSIS — M25511 Pain in right shoulder: Secondary | ICD-10-CM | POA: Insufficient documentation

## 2015-11-29 DIAGNOSIS — E1122 Type 2 diabetes mellitus with diabetic chronic kidney disease: Secondary | ICD-10-CM | POA: Insufficient documentation

## 2015-11-29 DIAGNOSIS — R079 Chest pain, unspecified: Secondary | ICD-10-CM | POA: Diagnosis present

## 2015-11-29 DIAGNOSIS — R072 Precordial pain: Secondary | ICD-10-CM

## 2015-11-29 DIAGNOSIS — Z7984 Long term (current) use of oral hypoglycemic drugs: Secondary | ICD-10-CM | POA: Insufficient documentation

## 2015-11-29 DIAGNOSIS — Z7982 Long term (current) use of aspirin: Secondary | ICD-10-CM | POA: Insufficient documentation

## 2015-11-29 DIAGNOSIS — Z87442 Personal history of urinary calculi: Secondary | ICD-10-CM | POA: Insufficient documentation

## 2015-11-29 DIAGNOSIS — N189 Chronic kidney disease, unspecified: Secondary | ICD-10-CM | POA: Insufficient documentation

## 2015-11-29 HISTORY — PX: CARDIAC CATHETERIZATION: SHX172

## 2015-11-29 LAB — BASIC METABOLIC PANEL
ANION GAP: 9 (ref 5–15)
BUN: 21 mg/dL — ABNORMAL HIGH (ref 6–20)
CHLORIDE: 103 mmol/L (ref 101–111)
CO2: 24 mmol/L (ref 22–32)
CREATININE: 1.73 mg/dL — AB (ref 0.61–1.24)
Calcium: 9.3 mg/dL (ref 8.9–10.3)
GFR calc non Af Amer: 45 mL/min — ABNORMAL LOW (ref >60)
GFR, EST AFRICAN AMERICAN: 52 mL/min — AB (ref >60)
Glucose, Bld: 116 mg/dL — ABNORMAL HIGH (ref 65–99)
Potassium: 4.1 mmol/L (ref 3.5–5.1)
Sodium: 136 mmol/L (ref 135–145)

## 2015-11-29 LAB — GLUCOSE, CAPILLARY
Glucose-Capillary: 120 mg/dL — ABNORMAL HIGH (ref 65–99)
Glucose-Capillary: 102 mg/dL — ABNORMAL HIGH (ref 65–99)

## 2015-11-29 SURGERY — LEFT HEART CATH AND CORONARY ANGIOGRAPHY

## 2015-11-29 MED ORDER — SODIUM CHLORIDE 0.9 % WEIGHT BASED INFUSION: 3.0000 mL/kg/h | INTRAVENOUS | Status: DC

## 2015-11-29 MED ORDER — FENTANYL CITRATE (PF) 100 MCG/2ML IJ SOLN
INTRAMUSCULAR | Status: AC
  Filled 2015-11-29: qty 2

## 2015-11-29 MED ORDER — SODIUM CHLORIDE 0.9% FLUSH: 3.0000 mL | Freq: Two times a day (BID) | INTRAVENOUS | Status: DC

## 2015-11-29 MED ORDER — LIDOCAINE HCL (PF) 1 % IJ SOLN
INTRAMUSCULAR | Status: DC | PRN
  Administered 2015-11-29: 5 mL

## 2015-11-29 MED ORDER — MIDAZOLAM HCL 2 MG/2ML IJ SOLN
INTRAMUSCULAR | Status: AC
  Filled 2015-11-29: qty 2

## 2015-11-29 MED ORDER — HEPARIN SODIUM (PORCINE) 1000 UNIT/ML IJ SOLN
INTRAMUSCULAR | Status: DC | PRN
  Administered 2015-11-29: 5000 [IU] via INTRAVENOUS

## 2015-11-29 MED ORDER — LIDOCAINE HCL (PF) 1 % IJ SOLN
INTRAMUSCULAR | Status: AC
  Filled 2015-11-29: qty 30

## 2015-11-29 MED ORDER — SODIUM CHLORIDE 0.9 % WEIGHT BASED INFUSION
3.0000 mL/kg/h | INTRAVENOUS | Status: DC
  Administered 2015-11-29: 3 mL/kg/h via INTRAVENOUS

## 2015-11-29 MED ORDER — FENTANYL CITRATE (PF) 100 MCG/2ML IJ SOLN
INTRAMUSCULAR | Status: DC | PRN
  Administered 2015-11-29: 25 ug via INTRAVENOUS

## 2015-11-29 MED ORDER — ACETAMINOPHEN 325 MG PO TABS: 650.0000 mg | ORAL_TABLET | ORAL | Status: DC | PRN

## 2015-11-29 MED ORDER — SODIUM CHLORIDE 0.9 % IV SOLN: 250.0000 mL | INTRAVENOUS | Status: DC | PRN

## 2015-11-29 MED ORDER — HEPARIN (PORCINE) IN NACL 2-0.9 UNIT/ML-% IJ SOLN
INTRAMUSCULAR | Status: DC | PRN
  Administered 2015-11-29: 1500 mL

## 2015-11-29 MED ORDER — SODIUM CHLORIDE 0.9% FLUSH: 3.0000 mL | INTRAVENOUS | Status: DC | PRN

## 2015-11-29 MED ORDER — SODIUM CHLORIDE 0.9 % WEIGHT BASED INFUSION
1.0000 mL/kg/h | INTRAVENOUS | Status: DC
  Administered 2015-11-29: 1 mL/kg/h via INTRAVENOUS

## 2015-11-29 MED ORDER — HEPARIN SODIUM (PORCINE) 1000 UNIT/ML IJ SOLN
INTRAMUSCULAR | Status: AC
  Filled 2015-11-29: qty 1

## 2015-11-29 MED ORDER — IOPAMIDOL (ISOVUE-370) INJECTION 76%
INTRAVENOUS | Status: AC
  Filled 2015-11-29: qty 100

## 2015-11-29 MED ORDER — ONDANSETRON HCL 4 MG/2ML IJ SOLN: 4.0000 mg | Freq: Four times a day (QID) | INTRAMUSCULAR | Status: DC | PRN

## 2015-11-29 MED ORDER — MIDAZOLAM HCL 2 MG/2ML IJ SOLN
INTRAMUSCULAR | Status: DC | PRN
  Administered 2015-11-29: 1 mg via INTRAVENOUS

## 2015-11-29 MED ORDER — VERAPAMIL HCL 2.5 MG/ML IV SOLN
INTRAVENOUS | Status: AC
  Filled 2015-11-29: qty 2

## 2015-11-29 MED ORDER — VERAPAMIL HCL 2.5 MG/ML IV SOLN
INTRAVENOUS | Status: DC | PRN
  Administered 2015-11-29: 10 mL via INTRA_ARTERIAL

## 2015-11-29 MED ORDER — HEPARIN (PORCINE) IN NACL 2-0.9 UNIT/ML-% IJ SOLN
INTRAMUSCULAR | Status: AC
  Filled 2015-11-29: qty 1000

## 2015-11-29 MED ORDER — IOPAMIDOL (ISOVUE-370) INJECTION 76%
INTRAVENOUS | Status: AC
  Filled 2015-11-29: qty 50

## 2015-11-29 MED ORDER — ASPIRIN 81 MG PO CHEW: 81.0000 mg | CHEWABLE_TABLET | ORAL | Status: DC

## 2015-11-29 SURGICAL SUPPLY — 9 items
CATH INFINITI 5 FR JL3.5 (CATHETERS) ×2 IMPLANT
CATH INFINITI JR4 5F (CATHETERS) ×2 IMPLANT
DEVICE RAD COMP TR BAND LRG (VASCULAR PRODUCTS) ×2 IMPLANT
GLIDESHEATH SLEND A-KIT 6F 22G (SHEATH) ×2 IMPLANT
KIT HEART LEFT (KITS) ×2 IMPLANT
PACK CARDIAC CATHETERIZATION (CUSTOM PROCEDURE TRAY) ×2 IMPLANT
TRANSDUCER W/STOPCOCK (MISCELLANEOUS) ×2 IMPLANT
TUBING CIL FLEX 10 FLL-RA (TUBING) ×2 IMPLANT
WIRE SAFE-T 1.5MM-J .035X260CM (WIRE) ×2 IMPLANT

## 2015-11-29 NOTE — Discharge Instructions (Signed)
Radial Site Care °Refer to this sheet in the next few weeks. These instructions provide you with information about caring for yourself after your procedure. Your health care provider may also give you more specific instructions. Your treatment has been planned according to current medical practices, but problems sometimes occur. Call your health care provider if you have any problems or questions after your procedure. °WHAT TO EXPECT AFTER THE PROCEDURE °After your procedure, it is typical to have the following: °· Bruising at the radial site that usually fades within 1-2 weeks. °· Blood collecting in the tissue (hematoma) that may be painful to the touch. It should usually decrease in size and tenderness within 1-2 weeks. °HOME CARE INSTRUCTIONS °· Take medicines only as directed by your health care provider. °· You may shower 24-48 hours after the procedure or as directed by your health care provider. Remove the bandage (dressing) and gently wash the site with plain soap and water. Pat the area dry with a clean towel. Do not rub the site, because this may cause bleeding. °· Do not take baths, swim, or use a hot tub until your health care provider approves. °· Check your insertion site every day for redness, swelling, or drainage. °· Do not apply powder or lotion to the site. °· Do not flex or bend the affected arm for 24 hours or as directed by your health care provider. °· Do not push or pull heavy objects with the affected arm for 24 hours or as directed by your health care provider. °· Do not lift over 10 lb (4.5 kg) for 5 days after your procedure or as directed by your health care provider. °· Ask your health care provider when it is okay to: °¨ Return to work or school. °¨ Resume usual physical activities or sports. °¨ Resume sexual activity. °· Do not drive home if you are discharged the same day as the procedure. Have someone else drive you. °· You may drive 24 hours after the procedure unless otherwise  instructed by your health care provider. °· Do not operate machinery or power tools for 24 hours after the procedure. °· If your procedure was done as an outpatient procedure, which means that you went home the same day as your procedure, a responsible adult should be with you for the first 24 hours after you arrive home. °· Keep all follow-up visits as directed by your health care provider. This is important. °SEEK MEDICAL CARE IF: °· You have a fever. °· You have chills. °· You have increased bleeding from the radial site. Hold pressure on the site. °SEEK IMMEDIATE MEDICAL CARE IF: °· You have unusual pain at the radial site. °· You have redness, warmth, or swelling at the radial site. °· You have drainage (other than a small amount of blood on the dressing) from the radial site. °· The radial site is bleeding, and the bleeding does not stop after 30 minutes of holding steady pressure on the site. °· Your arm or hand becomes pale, cool, tingly, or numb. °  °This information is not intended to replace advice given to you by your health care provider. Make sure you discuss any questions you have with your health care provider. °  °Document Released: 05/11/2010 Document Revised: 04/29/2014 Document Reviewed: 10/25/2013 °Elsevier Interactive Patient Education ©2016 [employer] ° °

## 2015-11-29 NOTE — Research (Signed)
CAD LAD Informed Consent   Subject Name: SHLOK RAZ  Subject met inclusion and exclusion criteria.  The informed consent form, study requirements and expectations were reviewed with the subject and questions and concerns were addressed prior to the signing of the consent form.  The subject verbalized understanding of the trail requirements.  The subject agreed to participate in the CAD LAD trial and signed the informed consent.  The informed consent was obtained prior to performance of any protocol-specific procedures for the subject.  A copy of the signed informed consent was given to the subject and a copy was placed in the subject's medical record.  Hedrick,Fenix Rorke W 11/29/2015, 8:09 AM

## 2015-12-04 ENCOUNTER — Telehealth: Payer: Self-pay | Admitting: Interventional Cardiology

## 2015-12-04 NOTE — Telephone Encounter (Signed)
Left message for patient to call back  

## 2015-12-04 NOTE — Telephone Encounter (Signed)
This is Dr.Nishan's patient message forwarded  his nurse Morrie Sheldon

## 2015-12-04 NOTE — Telephone Encounter (Signed)
New message      Pt wife is requesting the pts restrictions and when he can return to work. Please call.

## 2015-12-05 ENCOUNTER — Other Ambulatory Visit: Payer: Self-pay | Admitting: Urology

## 2015-12-05 NOTE — Telephone Encounter (Signed)
Informed patient's wife of Dr. Johnsie Cancel response. Will  fax to Dr. Irene Shipper office.

## 2015-12-05 NOTE — Telephone Encounter (Signed)
Patient is wanting to know work restrictions and dates he can return to work. Patient also needs to let his primary care doctor (Dr. Wenda Overland) know as well. Will need to fax response to Dr. Wenda Overland at [phone]. Will forward to Dr. Johnsie Cancel for advisement.

## 2015-12-05 NOTE — Telephone Encounter (Signed)
Cath was on 11/29/15 cors were normal can go back to work now with no restrictions

## 2015-12-05 NOTE — Telephone Encounter (Signed)
Follow Up   Adrian Jacobson is returning your call .Marland Kitchen Thanks

## 2015-12-12 ENCOUNTER — Encounter (HOSPITAL_COMMUNITY): Payer: Self-pay | Admitting: *Deleted

## 2015-12-13 NOTE — Progress Notes (Signed)
Cardiology Office Note    Date:  12/14/2015   ID:  Adrian Jacobson, DOB 06-20-1965, MRN [id number]  PCP:  Celedonio Savage, MD  Cardiologist:  Dr. Johnsie Cancel  CC: follow up after cath.   History of Present Illness:  Adrian Jacobson is a 50 y.o. male with a history of DMT2, GERD and OSA who kidney stones who presents to clinic for post cath follow up.   He was admitted in [facility] 7/23-7/24 for abdominal pain and right shoulder pain. Myoview at [facility] on 11/13/15 showed a large area of moderate reversibility involving the inferior myocardium concerning for ischemia, EF 55%. He saw Dr. Johnsie Cancel in the office on 11/27/15 and was set up for coronary angiography. Dr Tamala Julian performed a left heart cath on 11/29/15 which showed normal coronary arteries with normal filling pressures. His stress test was felt to be a false positive and he was cleared for any urologic procedures he may need.  Today he presents to clinic for follow up. No more chest pain or SOB. Some indigestion. No LE edema, PND or orthopnea. Sometimes he gest a little lightheaded but no syncope. He continues to have epigastric pain and would like a referral to GI. He does have his gallbladder and said his PCP said he would have him seen there.     Past Medical History:  Diagnosis Date  . Asthma   . Diabetes mellitus without complication ([place])   . GERD (gastroesophageal reflux disease)   . Renal disorder    kidney stones  . Sleep apnea    no CPAP    Past Surgical History:  Procedure Laterality Date  . CARDIAC CATHETERIZATION N/A 11/29/2015   Procedure: Left Heart Cath and Coronary Angiography;  Surgeon: Belva Crome, MD;  Location: [place] [facility];  Service: Cardiovascular;  Laterality: N/A;  . STONE EXTRACTION WITH BASKET    . TESTICLE SURGERY      Current Medications: Outpatient Medications Prior to Visit  Medication Sig Dispense Refill  . glipiZIDE (GLUCOTROL XL) 2.5 MG 24 hr tablet Take 2.5 mg by mouth every evening.     .  metFORMIN (GLUCOPHAGE-XR) 500 MG 24 hr tablet Take 2 tablets by mouth 2 (two) times daily.    . Multiple Vitamin (MULTIVITAMIN WITH MINERALS) TABS tablet Take 1 tablet by mouth every evening.    . ranitidine (ZANTAC) 150 MG tablet Take 150 mg by mouth 2 (two) times daily.    . tamsulosin (FLOMAX) 0.4 MG CAPS capsule Take 0.4 mg by mouth.    Marland Kitchen aspirin 81 MG tablet Take 81 mg by mouth every evening.     . cephALEXin (KEFLEX) 500 MG capsule Take 1 capsule (500 mg total) by mouth 4 (four) times daily. 20 capsule 0  . HYDROmorphone (DILAUDID) 4 MG tablet Take 1 tablet (4 mg total) by mouth every 6 (six) hours as needed for severe pain. 20 tablet 0  . naproxen (NAPROSYN) 500 MG tablet Take 1 tablet (500 mg total) by mouth 2 (two) times daily. 14 tablet 0  . oxyCODONE-acetaminophen (PERCOCET/ROXICET) 5-325 MG tablet Take 1 tablet by mouth every 6 (six) hours as needed for severe pain.    . promethazine (PHENERGAN) 25 MG tablet Take 1 tablet (25 mg total) by mouth every 6 (six) hours as needed. 12 tablet 1  . sucralfate (CARAFATE) 1 g tablet Take 1 tablet by mouth daily.     No facility-administered medications prior to visit.      Allergies:  Strawberry (diagnostic); Hydrocodone; and Niacin and related   Social History   Social History  . Marital status: Married    Spouse name: N/A  . Number of children: N/A  . Years of education: N/A   Social History Main Topics  . Smoking status: Never Smoker  . Smokeless tobacco: Never Used  . Alcohol use No  . Drug use: No  . Sexual activity: Not Asked   Other Topics Concern  . None   Social History Narrative  . None     Family History:  The patient's family hx of PGF and PGM had CVAs  ROS:   Please see the history of present illness.    ROS All other systems reviewed and are negative.   PHYSICAL EXAM:   VS:  BP 114/82   Pulse 90   Ht 5\' 11"  (1.803 m)   Wt 240 lb 1.9 oz (108.9 kg)   SpO2 98%   BMI 33.49 kg/m    GEN: Well nourished,  well developed, in no acute distress  HEENT: normal  Neck: no JVD, carotid bruits, or masses Cardiac: RRR; no murmurs, rubs, or gallops,no edema  Respiratory:  clear to auscultation bilaterally, normal work of breathing GI: soft, nontender, nondistended, + BS MS: no deformity or atrophy  Skin: warm and dry, no rash Neuro:  Alert and Oriented x 3, Strength and sensation are intact Psych: euthymic mood, full affect  Wt Readings from Last 3 Encounters:  12/14/15 240 lb 1.9 oz (108.9 kg)  11/29/15 241 lb (109.3 kg)  11/27/15 248 lb (112.5 kg)      Studies/Labs Reviewed:   EKG:  EKG is NOT ordered today.   Recent Labs: 11/24/2015: ALT 73 11/27/2015: Hemoglobin 13.8; Platelets 206 11/29/2015: BUN 21; Creatinine, Ser 1.73; Potassium 4.1; Sodium 136   Lipid Panel No results found for: CHOL, TRIG, HDL, CHOLHDL, VLDL, LDLCALC, LDLDIRECT  Additional studies/ records that were reviewed today include:   Conclusion 0000000   LV end diastolic pressure is normal.    Normal coronary arteries  Normal LV filling pressures  False positive nuclear stress test  Cleared for urologic procedure.       ASSESSMENT & PLAN:   Adrian Jacobson is a 50 y.o. male with a history of DMT2, GERD and OSA who kidney stones who presents to clinic for post cath follow up.   Abnormal stress test: false positive s/p LHC with normal cors. No need to long term cardiology follow up.  DMT2: continue follow up with PCP  Kidney stones: cleared for any urologic procedures from cardiac standpoint.   Abdominal pain: he would like a referral to GI  Medication Adjustments/Labs and Tests Ordered: Current medicines are reviewed at length with the patient today.  Concerns regarding medicines are outlined above.  Medication changes, Labs and Tests ordered today are listed in the Patient Instructions below. Patient Instructions  Medication Instructions:    Your physician recommends that you continue on your  current medications as directed. Please refer to the Current Medication list given to you today.   If you need a refill on your cardiac medications before your next appointment, please call your pharmacy.  Labwork: NONE ORDER TODAY    Testing/Procedures: NONE ORDER TODAY    Follow-Up:  YOU HAVE BEEN REFERRED TO AN GASTROENTEROLOGIST FOR ABDOMINAL PAIN     Any Other Special Instructions Will Be Listed Below (If Applicable).  Signed, Angelena Form, PA-C  12/14/2015 9:40 AM    [facility] [place], [place], [place]  [address] Phone: [phone]; Fax: [phone]

## 2015-12-14 ENCOUNTER — Encounter: Payer: Self-pay | Admitting: Internal Medicine

## 2015-12-14 ENCOUNTER — Ambulatory Visit (INDEPENDENT_AMBULATORY_CARE_PROVIDER_SITE_OTHER): Payer: BLUE CROSS/BLUE SHIELD | Admitting: Physician Assistant

## 2015-12-14 ENCOUNTER — Encounter: Payer: Self-pay | Admitting: *Deleted

## 2015-12-14 ENCOUNTER — Encounter: Payer: Self-pay | Admitting: Physician Assistant

## 2015-12-14 VITALS — BP 114/82 | HR 90 | Ht 71.0 in | Wt 240.1 lb

## 2015-12-14 DIAGNOSIS — N200 Calculus of kidney: Secondary | ICD-10-CM | POA: Diagnosis not present

## 2015-12-14 DIAGNOSIS — Z711 Person with feared health complaint in whom no diagnosis is made: Secondary | ICD-10-CM

## 2015-12-14 DIAGNOSIS — E118 Type 2 diabetes mellitus with unspecified complications: Secondary | ICD-10-CM

## 2015-12-14 DIAGNOSIS — R109 Unspecified abdominal pain: Secondary | ICD-10-CM

## 2015-12-14 DIAGNOSIS — Z789 Other specified health status: Secondary | ICD-10-CM

## 2015-12-14 NOTE — Patient Instructions (Signed)
Medication Instructions:    Your physician recommends that you continue on your current medications as directed. Please refer to the Current Medication list given to you today.   If you need a refill on your cardiac medications before your next appointment, please call your pharmacy.  Labwork: NONE ORDER TODAY    Testing/Procedures: NONE ORDER TODAY    Follow-Up:  YOU HAVE BEEN REFERRED TO AN GASTROENTEROLOGIST FOR ABDOMINAL PAIN     Any Other Special Instructions Will Be Listed Below (If Applicable).

## 2015-12-18 ENCOUNTER — Ambulatory Visit (HOSPITAL_COMMUNITY): Admission: RE | Admit: 2015-12-18 | Payer: BLUE CROSS/BLUE SHIELD | Source: Ambulatory Visit | Admitting: Urology

## 2015-12-18 HISTORY — DX: Unspecified asthma, uncomplicated: J45.909

## 2015-12-18 HISTORY — DX: Gastro-esophageal reflux disease without esophagitis: K21.9

## 2015-12-18 HISTORY — DX: Sleep apnea, unspecified: G47.30

## 2015-12-18 SURGERY — LITHOTRIPSY, ESWL
Anesthesia: LOCAL | Laterality: Left

## 2016-02-27 ENCOUNTER — Ambulatory Visit (INDEPENDENT_AMBULATORY_CARE_PROVIDER_SITE_OTHER): Payer: BLUE CROSS/BLUE SHIELD | Admitting: Internal Medicine

## 2016-02-27 ENCOUNTER — Encounter: Payer: Self-pay | Admitting: Internal Medicine

## 2016-02-27 VITALS — BP 144/82 | HR 80 | Resp 16 | Ht 69.75 in | Wt 252.2 lb

## 2016-02-27 DIAGNOSIS — K625 Hemorrhage of anus and rectum: Secondary | ICD-10-CM

## 2016-02-27 DIAGNOSIS — R194 Change in bowel habit: Secondary | ICD-10-CM | POA: Diagnosis not present

## 2016-02-27 DIAGNOSIS — R1013 Epigastric pain: Secondary | ICD-10-CM | POA: Diagnosis not present

## 2016-02-27 NOTE — Progress Notes (Addendum)
Adrian Jacobson 50 y.o. Nov 11, 1965 [id number] Referred by: Celedonio Savage, MD  Assessment & Plan:   Encounter Diagnoses  Name Primary?  . Abdominal pain, epigastric Yes  . Change in bowel habits   . Rectal bleeding    Broad differential here - IBD possible, GERD/dyspepsia, IBS, bleeding hemorrhoids seems likely. Evaluate with EGD and colonoscopy. The risks and benefits as well as alternatives of endoscopic procedure(s) have been discussed and reviewed. All questions answered. The patient agrees to proceed.  He had an ultrasound without stones at [facility] abnormal LFTs probably fatty liver as is suspected on ultrasound can recheck those, could also been related to his acute hydronephrosis possibly Other Muscle Release Etc.   Will clarify family history of colon cancer and polyps when he returns. I neglected to discuss that with him today.  I appreciate the opportunity to care for this patient. CC: Celedonio Savage, MD   Subjective:   Chief Complaint:  Epigastric pain, constipation, rectal bleeding  HPI Pleasant middle-aged white man here with his wife and daughter, he said a several year history of intermittent epigastric and some left upper quadrant pain described as a dull pain, can get worse after eating things like tomatoes. Sometimes burning classic heartburn symptoms. He's had indifferent responses to over-the-counter Prilosec and Nexium. Seems like it might help at first and then things get worse. Currently using ranitidine which she says helps about half of the time. Has had problems with chest pain symptoms there were severe and lead to ER visits a couple of times and interventional cardiac workup including a catheterization that was negative after a positive Myoview. He is not thought to have coronary disease.  He has had less frequent defecation over the last several months, generally moves his bowels once a day. He has had intermittent bright red blood per  rectum he thinks he might have hemorrhoids. He'll see in the water and on the stool.  Allergies  Allergen Reactions  . Strawberry (Diagnostic)     Anaphylaxis   . Omeprazole Other (See Comments)    Abdominal pain  . Hydrocodone     Mood swings   . Niacin And Related     Feel like skin burning    Outpatient Medications Prior to Visit  Medication Sig Dispense Refill  . glipiZIDE (GLUCOTROL XL) 2.5 MG 24 hr tablet Take 2.5 mg by mouth every evening.     . metFORMIN (GLUCOPHAGE-XR) 500 MG 24 hr tablet Take 2 tablets by mouth 2 (two) times daily.    . ranitidine (ZANTAC) 150 MG tablet Take 150 mg by mouth 2 (two) times daily.    . Multiple Vitamin (MULTIVITAMIN WITH MINERALS) TABS tablet Take 1 tablet by mouth every evening.    . tamsulosin (FLOMAX) 0.4 MG CAPS capsule Take 0.4 mg by mouth.     No facility-administered medications prior to visit.    Past Medical History:  Diagnosis Date  . Asthma   . Diabetes mellitus without complication ([place])   . GERD (gastroesophageal reflux disease)   . Renal disorder    kidney stones  . Sleep apnea    no CPAP   Past Surgical History:  Procedure Laterality Date  . CARDIAC CATHETERIZATION N/A 11/29/2015   Procedure: Left Heart Cath and Coronary Angiography;  Surgeon: Belva Crome, MD;  Location: [facility];  Service: Cardiovascular;  Laterality: N/A;  . STONE EXTRACTION WITH BASKET    . TESTICLE SURGERY     Social  History   Social History  . Marital status: Married    Spouse name: N/A  . Number of children: N/A  . Years of education: N/A   Occupational History  . supervisor    Social History Main Topics  . Smoking status: Never Smoker  . Smokeless tobacco: Never Used  . Alcohol use No  . Drug use: No  . Sexual activity: Not Asked   Other Topics Concern  . 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   3 sons one daughter, married   No caffeine no alcohol no  tobacco no drugs   02/27/2016      Family history is notable, for colon cancer and polyps though I don't know which relative, there is also diabetes heart disease and irritable bowel syndrome.   Review of Systems Hematuria, kidney stones. He is passing them. He has seen Dr. Tresa Moore. All other systems negative or in history of present illness.  Objective:   Physical Exam @BP  (!) 144/82   Pulse 80   Resp 16   Ht 5' 9.75" (1.772 m) Comment: measured without shoes  Wt 252 lb 4 oz (114.4 kg)   BMI 36.45 kg/m @  General:  Well-developed, well-nourished and in no acute distress, he is obese Eyes:  anicteric. ENT:   Mouth and posterior pharynx free of lesions.  Neck:   supple w/o thyromegaly or mass.  Lungs: Clear to auscultation bilaterally. Heart:  S1S2, no rubs, murmurs, gallops. Abdomen:  soft, non-tender, no hepatosplenomegaly, hernia, or mass and BS+.  Rectal: Deferred until colonoscopy Lymph:  no cervical or supraclavicular adenopathy. Extremities:   no edema, cyanosis or clubbing Skin   no rash. Neuro:  A&O x 3.  Psych:  appropriate mood and  Affect.   Data Reviewed:  Cardiology notes from 2017 were reviewed  CT renal stone study showing hydronephrosis left ureter April 2017 sigmoid diverticulosis noted kidney stone  CBC in August normal except for mild eosinophilia Mild elevation in AST ALT 45 and 73 glucose was 157 in August similar findings on an earlier August metabolic panel also creatinine 1.73 and August  Abdominal ultrasound July 2017 [facility] hospital showing changes consistent with hepatic steatosis

## 2016-02-27 NOTE — Patient Instructions (Signed)
  You have been scheduled for an endoscopy and colonoscopy. Please follow the written instructions given to you at your visit today. Please pick up your prep supplies at the pharmacy. If you use inhalers (even only as needed), please bring them with you on the day of your procedure. Your physician has requested that you go to www.startemmi.com and enter the access code given to you at your visit today. This web site gives a general overview about your procedure. However, you should still follow specific instructions given to you by our office regarding your preparation for the procedure.    I appreciate the opportunity to care for you. Carl Gessner, MD, FACG 

## 2016-03-21 ENCOUNTER — Ambulatory Visit (AMBULATORY_SURGERY_CENTER): Payer: BLUE CROSS/BLUE SHIELD | Admitting: Internal Medicine

## 2016-03-21 ENCOUNTER — Encounter: Payer: Self-pay | Admitting: Internal Medicine

## 2016-03-21 VITALS — BP 135/85 | HR 76 | Temp 98.9°F | Resp 23 | Ht 69.75 in | Wt 252.0 lb

## 2016-03-21 DIAGNOSIS — K621 Rectal polyp: Secondary | ICD-10-CM

## 2016-03-21 DIAGNOSIS — R1013 Epigastric pain: Secondary | ICD-10-CM

## 2016-03-21 DIAGNOSIS — R194 Change in bowel habit: Secondary | ICD-10-CM

## 2016-03-21 DIAGNOSIS — D128 Benign neoplasm of rectum: Secondary | ICD-10-CM

## 2016-03-21 DIAGNOSIS — K623 Rectal prolapse: Secondary | ICD-10-CM | POA: Diagnosis not present

## 2016-03-21 DIAGNOSIS — D129 Benign neoplasm of anus and anal canal: Secondary | ICD-10-CM

## 2016-03-21 LAB — GLUCOSE, CAPILLARY
GLUCOSE-CAPILLARY: 141 mg/dL — AB (ref 65–99)
Glucose-Capillary: 119 mg/dL — ABNORMAL HIGH (ref 65–99)

## 2016-03-21 MED ORDER — DICYCLOMINE HCL 20 MG PO TABS: 20.0000 mg | ORAL_TABLET | Freq: Four times a day (QID) | ORAL | 3 refills | Status: DC | PRN

## 2016-03-21 MED ORDER — SODIUM CHLORIDE 0.9 % IV SOLN: 500.0000 mL | INTRAVENOUS | Status: DC

## 2016-03-21 NOTE — Op Note (Signed)
[place] Patient Name: Adrian Jacobson Procedure Date: 03/21/2016 4:04 PM MRN: [id number] Endoscopist: Gatha Mayer , MD Age: 50 Referring MD:  Date of Birth: April 09, 1966 Gender: Male Account #: [id number] Procedure:                Upper GI endoscopy Indications:              Epigastric abdominal pain, Abdominal pain in the                            left upper quadrant Medicines:                Monitored Anesthesia Care Procedure:                Pre-Anesthesia Assessment:                           - Prior to the procedure, a History and Physical                            was performed, and patient medications and                            allergies were reviewed. The patient's tolerance of                            previous anesthesia was also reviewed. The risks                            and benefits of the procedure and the sedation                            options and risks were discussed with the patient.                            All questions were answered, and informed consent                            was obtained. Prior Anticoagulants: The patient has                            taken no previous anticoagulant or antiplatelet                            agents. ASA Grade Assessment: III - A patient with                            severe systemic disease. After reviewing the risks                            and benefits, the patient was deemed in                            satisfactory condition to undergo the procedure.  After obtaining informed consent, the endoscope was                            passed under direct vision. Throughout the                            procedure, the patient's blood pressure, pulse, and                            oxygen saturations were monitored continuously. The                            Model GIF-[id number]) scope was introduced                            through the mouth, and advanced  to the second part                            of duodenum. The upper GI endoscopy was                            accomplished without difficulty. The patient                            tolerated the procedure well. Scope In: Scope Out: Findings:                 The esophagus was normal.                           The stomach was normal.                           The examined duodenum was normal.                           The cardia and gastric fundus were normal on                            retroflexion. Complications:            No immediate complications. Estimated Blood Loss:     Estimated blood loss: none. Impression:               - Normal esophagus.                           - Normal stomach.                           - Normal examined duodenum.                           - No specimens collected. Recommendation:           - Patient has a contact number available for  emergencies. The signs and symptoms of potential                            delayed complications were discussed with the                            patient. Return to normal activities tomorrow.                            Written discharge instructions were provided to the                            patient.                           - Resume previous diet.                           - Continue present medications.                           - See the other procedure note for documentation of                            additional recommendations.                           - Use Bentyl (dicyclomine) 20 mg PO QID 30 min AC. Gatha Mayer, MD 03/21/2016 4:45:13 PM This report has been signed electronically.

## 2016-03-21 NOTE — Progress Notes (Signed)
To PACU, vss patent aw report to rn 

## 2016-03-21 NOTE — Progress Notes (Signed)
Called to room to assist during endoscopic procedure.  Patient ID and intended procedure confirmed with present staff. Received instructions for my participation in the procedure from the performing physician.  

## 2016-03-21 NOTE — Patient Instructions (Addendum)
Upper endoscopy was normal.  One tiny rectal polyp removed and some small hemorrhoids seen.  I will let you know pathology results and when to have another routine colonoscopy by mail.  Try dicyclomine to help the abdominal pain. If not helpful and still bothered please make a follow-up appointment.  I appreciate the opportunity to care for you. Gatha Mayer, MD, FACG    YOU HAD AN ENDOSCOPIC PROCEDURE TODAY AT [facility]:   Refer to the procedure report that was given to you for any specific questions about what was found during the examination.  If the procedure report does not answer your questions, please call your gastroenterologist to clarify.  If you requested that your care partner not be given the details of your procedure findings, then the procedure report has been included in a sealed envelope for you to review at your convenience later.  YOU SHOULD EXPECT: Some feelings of bloating in the abdomen. Passage of more gas than usual.  Walking can help get rid of the air that was put into your GI tract during the procedure and reduce the bloating. If you had a lower endoscopy (such as a colonoscopy or flexible sigmoidoscopy) you may notice spotting of blood in your stool or on the toilet paper. If you underwent a bowel prep for your procedure, you may not have a normal bowel movement for a few days.  Please Note:  You might notice some irritation and congestion in your nose or some drainage.  This is from the oxygen used during your procedure.  There is no need for concern and it should clear up in a day or so.  SYMPTOMS TO REPORT IMMEDIATELY:   Following lower endoscopy (colonoscopy or flexible sigmoidoscopy):  Excessive amounts of blood in the stool  Significant tenderness or worsening of abdominal pains  Swelling of the abdomen that is new, acute  Fever of 100F or higher   Following upper endoscopy (EGD)  Vomiting of blood or coffee ground  material  New chest pain or pain under the shoulder blades  Painful or persistently difficult swallowing  New shortness of breath  Fever of 100F or higher  Black, tarry-looking stools  For urgent or emergent issues, a gastroenterologist can be reached at any hour by calling [phone].   DIET:  We do recommend a small meal at first, but then you may proceed to your regular diet.  Drink plenty of fluids but you should avoid alcoholic beverages for 24 hours.  ACTIVITY:  You should plan to take it easy for the rest of today and you should NOT DRIVE or use heavy machinery until tomorrow (because of the sedation medicines used during the test).    FOLLOW UP: Our staff will call the number listed on your records the next business day following your procedure to check on you and address any questions or concerns that you may have regarding the information given to you following your procedure. If we do not reach you, we will leave a message.  However, if you are feeling well and you are not experiencing any problems, there is no need to return our call.  We will assume that you have returned to your regular daily activities without incident.  If any biopsies were taken you will be contacted by phone or by letter within the next 1-3 weeks.  Please call us at [phone] if you have not heard about the biopsies in 3 weeks.  SIGNATURES/CONFIDENTIALITY: You and/or your care partner have signed paperwork which will be entered into your electronic medical record.  These signatures attest to the fact that that the information above on your After Visit Summary has been reviewed and is understood.  Full responsibility of the confidentiality of this discharge information lies with you and/or your care-partner.

## 2016-03-21 NOTE — Op Note (Signed)
[place] Patient Name: Adrian Jacobson Procedure Date: 03/21/2016 4:04 PM MRN: [id number] Endoscopist: Gatha Mayer , MD Age: 50 Referring MD:  Date of Birth: Aug 17, 1965 Gender: Male Account #: [id number] Procedure:                Colonoscopy Indications:              Rectal bleeding, Change in bowel habits Medicines:                Propofol per Anesthesia, Monitored Anesthesia Care Procedure:                Pre-Anesthesia Assessment:                           - Prior to the procedure, a History and Physical                            was performed, and patient medications and                            allergies were reviewed. The patient's tolerance of                            previous anesthesia was also reviewed. The risks                            and benefits of the procedure and the sedation                            options and risks were discussed with the patient.                            All questions were answered, and informed consent                            was obtained. Prior Anticoagulants: The patient has                            taken no previous anticoagulant or antiplatelet                            agents. ASA Grade Assessment: III - A patient with                            severe systemic disease. After reviewing the risks                            and benefits, the patient was deemed in                            satisfactory condition to undergo the procedure.                           After obtaining informed consent, the colonoscope  was passed under direct vision. Throughout the                            procedure, the patient's blood pressure, pulse, and                            oxygen saturations were monitored continuously. The                            Model CF-HQ190L [id number]) scope was introduced                            through the anus and advanced to the the cecum,        identified by appendiceal orifice and ileocecal                            valve. The colonoscopy was performed without                            difficulty. The patient tolerated the procedure                            well. The quality of the bowel preparation was                            excellent. The bowel preparation used was Miralax.                            The appendiceal orifice and the rectum were                            photographed. Scope In: 4:16:03 PM Scope Out: 4:30:57 PM Scope Withdrawal Time: 0 hours 12 minutes 5 seconds  Total Procedure Duration: 0 hours 14 minutes 54 seconds  Findings:                 The perianal and digital rectal examinations were                            normal. Pertinent negatives include normal prostate                            (size, shape, and consistency).                           A 3 mm polyp was found in the rectum. The polyp was                            sessile. The polyp was removed with a cold biopsy                            forceps. Resection and retrieval were complete.  Verification of patient identification for the                            specimen was done. Estimated blood loss was minimal.                           Internal hemorrhoids were found during retroflexion.                           The exam was otherwise without abnormality on                            direct and retroflexion views. Complications:            No immediate complications. Estimated Blood Loss:     Estimated blood loss was minimal. Impression:               - One 3 mm polyp in the rectum, removed with a cold                            biopsy forceps. Resected and retrieved.                           - Internal hemorrhoids.                           - The examination was otherwise normal on direct                            and retroflexion views. Recommendation:           - Patient has a contact number  available for                            emergencies. The signs and symptoms of potential                            delayed complications were discussed with the                            patient. Return to normal activities tomorrow.                            Written discharge instructions were provided to the                            patient.                           - Resume previous diet.                           - Continue present medications.                           - Repeat colonoscopy is recommended. The  colonoscopy date will be determined after pathology                            results from today's exam become available for                            review.                           - Use Bentyl (dicyclomine) 20 mg PO QID 30 min AC. Gatha Mayer, MD 03/21/2016 4:48:00 PM This report has been signed electronically.

## 2016-03-22 ENCOUNTER — Telehealth: Payer: Self-pay

## 2016-03-22 NOTE — Telephone Encounter (Signed)
  Follow up Call-  Call back number 03/21/2016  Post procedure Call Back phone  # [phone]  Permission to leave phone message Yes  Some recent data might be hidden     Patient questions:  Do you have a fever, pain , or abdominal swelling? No. Pain Score  0 *  Have you tolerated food without any problems? Yes.    Have you been able to return to your normal activities? Yes.    Do you have any questions about your discharge instructions: Diet   No. Medications  No. Follow up visit  No.  Do you have questions or concerns about your Care? No.  Actions: * If pain score is 4 or above: No action needed, pain <4.

## 2016-03-28 NOTE — Progress Notes (Signed)
Benign prolapse type polyp in rectum] Recall colonoscopy 2027 My Chart note to patient  No letter

## 2016-12-04 LAB — HEMOGLOBIN A1C: HEMOGLOBIN A1C: 7.3

## 2016-12-04 LAB — LIPID PANEL
Cholesterol: 204 — AB (ref 0–200)
Triglycerides: 357 — AB (ref 40–160)

## 2016-12-04 LAB — BASIC METABOLIC PANEL: Glucose: 170

## 2017-01-02 ENCOUNTER — Ambulatory Visit (INDEPENDENT_AMBULATORY_CARE_PROVIDER_SITE_OTHER): Payer: Managed Care, Other (non HMO) | Admitting: Family Medicine

## 2017-01-02 ENCOUNTER — Encounter: Payer: Self-pay | Admitting: Family Medicine

## 2017-01-02 ENCOUNTER — Other Ambulatory Visit: Payer: Self-pay

## 2017-01-02 VITALS — BP 112/82 | HR 80 | Temp 96.5°F | Resp 18 | Ht 71.0 in | Wt 250.0 lb

## 2017-01-02 DIAGNOSIS — K219 Gastro-esophageal reflux disease without esophagitis: Secondary | ICD-10-CM

## 2017-01-02 DIAGNOSIS — N200 Calculus of kidney: Secondary | ICD-10-CM | POA: Diagnosis not present

## 2017-01-02 DIAGNOSIS — E785 Hyperlipidemia, unspecified: Secondary | ICD-10-CM | POA: Diagnosis not present

## 2017-01-02 DIAGNOSIS — I10 Essential (primary) hypertension: Secondary | ICD-10-CM | POA: Diagnosis not present

## 2017-01-02 DIAGNOSIS — E119 Type 2 diabetes mellitus without complications: Secondary | ICD-10-CM | POA: Diagnosis not present

## 2017-01-02 DIAGNOSIS — G4733 Obstructive sleep apnea (adult) (pediatric): Secondary | ICD-10-CM

## 2017-01-02 DIAGNOSIS — Z23 Encounter for immunization: Secondary | ICD-10-CM

## 2017-01-02 HISTORY — DX: Essential (primary) hypertension: I10

## 2017-01-02 HISTORY — DX: Hyperlipidemia, unspecified: E78.5

## 2017-01-02 MED ORDER — ROSUVASTATIN CALCIUM 20 MG PO TABS: 20.0000 mg | ORAL_TABLET | Freq: Every day | ORAL | 3 refills | Status: DC

## 2017-01-02 MED ORDER — GLIPIZIDE ER 2.5 MG PO TB24: 2.5000 mg | ORAL_TABLET | Freq: Every evening | ORAL | 3 refills | Status: DC

## 2017-01-02 MED ORDER — METFORMIN HCL ER 500 MG PO TB24: 1000.0000 mg | ORAL_TABLET | Freq: Two times a day (BID) | ORAL | 3 refills | Status: DC

## 2017-01-02 NOTE — Patient Instructions (Addendum)
Start rosuvastatin daily Watch the triglycerides in your diet Drink plenty of water Try to get exercise daily  Need records Dr Wenda Overland  Need to repeat labs in Feb Come with your wife next month

## 2017-01-02 NOTE — Progress Notes (Signed)
Chief Complaint  Patient presents with  . Diabetes  new patient States his DM is well controlled Has been diabetic for 10 y His PCP started him on lisinopril this year for "protein in my urine" Also has some numbness in both lateral feet Sees eye doc yearly and says no retinopathy Is not on a statin - but should be.  His LDL was 124 with a HDL of 29 in Aug. overweight - no exercise - discussed Has recurrent kidney stones GERD He had chest pain - thought to be GERD.  He had a cardiac cath with normal coronaries.    Patient Active Problem List   Diagnosis Date Noted  . Type 2 diabetes mellitus ([place]) 01/02/2017  . Essential hypertension 01/02/2017  . HLD (hyperlipidemia) 01/02/2017  . Chronic GERD 01/02/2017  . Kidney stones 01/02/2017  . OSA (obstructive sleep apnea) 01/02/2017    Outpatient Encounter Prescriptions as of 01/02/2017  Medication Sig  . lisinopril (PRINIVIL,ZESTRIL) 5 MG tablet Take 5 mg by mouth daily.  . metFORMIN (GLUCOPHAGE-XR) 500 MG 24 hr tablet Take 2 tablets (1,000 mg total) by mouth 2 (two) times daily.  . ranitidine (ZANTAC) 150 MG tablet Take 150 mg by mouth 2 (two) times daily.  . tamsulosin (FLOMAX) 0.4 MG CAPS capsule Take 0.4 mg by mouth daily.  . [DISCONTINUED] metFORMIN (GLUCOPHAGE-XR) 500 MG 24 hr tablet Take 2 tablets by mouth 2 (two) times daily.  Marland Kitchen glipiZIDE (GLUCOTROL XL) 2.5 MG 24 hr tablet Take 1 tablet (2.5 mg total) by mouth every evening.  . rosuvastatin (CRESTOR) 20 MG tablet Take 1 tablet (20 mg total) by mouth daily.   No facility-administered encounter medications on file as of 01/02/2017.     Past Medical History:  Diagnosis Date  . Allergy   . Anxiety   . Asthma   . Chronic kidney disease    stones  . Depression   . Diabetes mellitus without complication ([place])   . Essential hypertension 01/02/2017  . GERD (gastroesophageal reflux disease)   . HLD (hyperlipidemia) 01/02/2017  . Sleep apnea    no CPAP    Past Surgical  History:  Procedure Laterality Date  . CARDIAC CATHETERIZATION N/A 11/29/2015   Procedure: Left Heart Cath and Coronary Angiography;  Surgeon: Belva Crome, MD;  Location: [facility];  Service: Cardiovascular;  Laterality: N/A;  . STONE EXTRACTION WITH BASKET    . TESTICLE SURGERY      Social History   Social History  . Marital status: Married    Spouse name: tracy  . Number of children: 2  . Years of education: 13   Occupational History  . supervisor-maintenance    Social History Main Topics  . Smoking status: Never Smoker  . Smokeless tobacco: Never Used  . Alcohol use No  . Drug use: No  . Sexual activity: Yes    Birth control/ protection: Surgical   Other Topics Concern  . 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   3 sons one daughter, married ( 2 children and two step)   No caffeine no alcohol no tobacco no drugs   Likes computers, farming, outdoors       Family History  Problem Relation Age of Onset  . COPD Mother   . Diabetes Mother   . Hyperlipidemia Mother   . Hypertension Father   . Colon cancer Maternal Uncle 60  Review of Systems  Constitutional: Negative for chills, fever and weight loss.  HENT: Negative for congestion and hearing loss.   Eyes: Negative for blurred vision and pain.  Respiratory: Negative for cough and shortness of breath.   Cardiovascular: Negative for chest pain and leg swelling.  Gastrointestinal: Negative for abdominal pain, constipation, diarrhea and heartburn.  Genitourinary: Positive for flank pain. Negative for dysuria and frequency.  Musculoskeletal: Negative for falls, joint pain and myalgias.  Neurological: Negative for dizziness, seizures and headaches.  Psychiatric/Behavioral: Negative for depression. The patient is not nervous/anxious and does not have insomnia.     BP 112/82 (BP Location: Right Arm, Patient Position: Sitting, Cuff Size:  Normal)   Pulse 80   Temp (!) 96.5 F (35.8 C) (Temporal)   Resp 18   Ht 5\' 11"  (1.803 m)   Wt 250 lb (113.4 kg)   SpO2 97%   BMI 34.87 kg/m   Physical Exam  Constitutional: He is oriented to person, place, and time. He appears well-developed and well-nourished.  HENT:  Head: Normocephalic and atraumatic.  Mouth/Throat: Oropharynx is clear and moist.  Eyes: Pupils are equal, round, and reactive to light. Conjunctivae are normal.  Neck: Normal range of motion. Neck supple.  Cardiovascular: Normal rate, regular rhythm and normal heart sounds.   Pulmonary/Chest: Effort normal and breath sounds normal. No respiratory distress.  Abdominal: Soft. Bowel sounds are normal.  Musculoskeletal: Normal range of motion. He exhibits no edema.  Neurological: He is alert and oriented to person, place, and time.  Gait normal  Skin: Skin is warm and dry.  Psychiatric: He has a normal mood and affect. His behavior is normal. Thought content normal.  Nursing note and vitals reviewed. ASSESSMENT/PLAN:   1. Need for influenza vaccination - Flu Vaccine QUAD 36+ mos IM  2. Type 2 diabetes mellitus without complication, without long-term current use of insulin (HCC) Last A1c 7.3.  Discussed diet and exercise  3. Essential hypertension On lisinopril  4. Hyperlipidemia, unspecified hyperlipidemia type Start crestor 20  5. Chronic GERD On zantac  6. Kidney stones [facility] Greater than 50% of this visit was spent in counseling and coordinating care.  Total face to face time:  45 min spent largely in discussing lifestyle and prevention   Patient Instructions  Start rosuvastatin daily Watch the triglycerides in your diet Drink plenty of water Try to get exercise daily  Need records Dr Wenda Overland  Need to repeat labs in Feb Come with your wife next month     Raylene Everts, MD

## 2017-01-13 ENCOUNTER — Encounter: Payer: Self-pay | Admitting: Family Medicine

## 2017-01-13 MED ORDER — LISINOPRIL 5 MG PO TABS: 5.0000 mg | ORAL_TABLET | Freq: Every day | ORAL | 3 refills | Status: DC

## 2017-01-17 ENCOUNTER — Other Ambulatory Visit: Payer: Self-pay | Admitting: Urology

## 2017-01-18 ENCOUNTER — Encounter: Payer: Self-pay | Admitting: Family Medicine

## 2017-01-20 MED ORDER — GLUCOSE BLOOD VI STRP: ORAL_STRIP | 0 refills | Status: DC

## 2017-01-20 MED ORDER — LANCETS MISC: 0 refills | Status: DC

## 2017-01-31 ENCOUNTER — Ambulatory Visit: Payer: Managed Care, Other (non HMO) | Admitting: Family Medicine

## 2017-02-05 ENCOUNTER — Ambulatory Visit (HOSPITAL_BASED_OUTPATIENT_CLINIC_OR_DEPARTMENT_OTHER): Admit: 2017-02-05 | Payer: Managed Care, Other (non HMO) | Admitting: Urology

## 2017-02-05 ENCOUNTER — Encounter (HOSPITAL_BASED_OUTPATIENT_CLINIC_OR_DEPARTMENT_OTHER): Payer: Self-pay

## 2017-02-05 SURGERY — CYSTOURETEROSCOPY, WITH RETROGRADE PYELOGRAM AND STENT INSERTION
Anesthesia: General | Laterality: Bilateral

## 2017-02-11 ENCOUNTER — Other Ambulatory Visit: Payer: Self-pay

## 2017-02-12 ENCOUNTER — Other Ambulatory Visit: Payer: Self-pay

## 2017-02-20 ENCOUNTER — Ambulatory Visit (INDEPENDENT_AMBULATORY_CARE_PROVIDER_SITE_OTHER): Payer: Managed Care, Other (non HMO) | Admitting: Family Medicine

## 2017-02-20 ENCOUNTER — Encounter: Payer: Self-pay | Admitting: Family Medicine

## 2017-02-20 VITALS — BP 120/78 | HR 86 | Temp 97.0°F | Resp 16 | Ht 71.0 in | Wt 248.2 lb

## 2017-02-20 DIAGNOSIS — E119 Type 2 diabetes mellitus without complications: Secondary | ICD-10-CM

## 2017-02-20 DIAGNOSIS — E785 Hyperlipidemia, unspecified: Secondary | ICD-10-CM | POA: Diagnosis not present

## 2017-02-20 DIAGNOSIS — I10 Essential (primary) hypertension: Secondary | ICD-10-CM

## 2017-02-20 MED ORDER — GLIPIZIDE ER 5 MG PO TB24: 5.0000 mg | ORAL_TABLET | Freq: Every day | ORAL | 3 refills | Status: DC

## 2017-02-20 NOTE — Patient Instructions (Addendum)
Reduce the metformin to 500 2X a day Increase the glipizide to 5 mg every morning Be careful with diabetic diet Stay active Due for re check and labs in 6 months

## 2017-02-20 NOTE — Progress Notes (Signed)
Chief Complaint  Patient presents with  . Hyperlipidemia  . Hypertension  . Diabetes   Patient is here for follow-up. He has well-controlled diabetes with an A1c of 7.3. His blood pressure is well controlled. He complains today that his metformin is causing him to have stomach upset.  If he takes 1 pill twice a day he feels fine, at 2 pills twice a day he has GI distress.  We discussed that he can reduce his metformin to 1 pill twice a day, and double his glipizide.  He is cautioned that he has an increased risk of hypoglycemia.  I discussed the importance of not skipping meals, and eating balanced carbohydrates and proteins throughout the day. He does not have any regular exercise although he is physically busy at his job. He has had his flu shot.  He has had his pneumonia shot.  He is due for an eye exam and is reminded.  No problems with his feet, numbness or neuropathy.  Patient Active Problem List   Diagnosis Date Noted  . Type 2 diabetes mellitus ([place]) 01/02/2017  . Essential hypertension 01/02/2017  . HLD (hyperlipidemia) 01/02/2017  . Chronic GERD 01/02/2017  . Kidney stones 01/02/2017  . OSA (obstructive sleep apnea) 01/02/2017    Outpatient Encounter Prescriptions as of 02/20/2017  Medication Sig  . glucose blood test strip Use as instructed  . Lanc[place] MISC Use as directed to check blood sugar twice daily  . lisinopril (PRINIVIL,ZESTRIL) 5 MG tablet Take 1 tablet (5 mg total) by mouth daily.  . metFORMIN (GLUCOPHAGE-XR) 500 MG 24 hr tablet Take 2 tablets (1,000 mg total) by mouth 2 (two) times daily.  . ranitidine (ZANTAC) 150 MG tablet Take 150 mg by mouth 2 (two) times daily.  . rosuvastatin (CRESTOR) 20 MG tablet Take 1 tablet (20 mg total) by mouth daily.  . tamsulosin (FLOMAX) 0.4 MG CAPS capsule Take 0.4 mg by mouth daily.  Marland Kitchen glipiZIDE (GLUCOTROL XL) 5 MG 24 hr tablet Take 1 tablet (5 mg total) by mouth daily with breakfast.   No facility-administered encounter  medications on file as of 02/20/2017.     Allergies  Allergen Reactions  . Strawberry Flavor Anaphylaxis    Anything with the fruit  . Hydrocodone Other (See Comments)    Mood swings   . Niacin And Related Other (See Comments)    Feel like skin burning   . Omeprazole Other (See Comments)    Abdominal pain    Review of Systems  Constitutional: Positive for unexpected weight change. Negative for activity change and appetite change.       Has lost 4 pounds  HENT: Negative for congestion and dental problem.   Eyes: Negative for discharge and visual disturbance.  Respiratory: Negative for cough and shortness of breath.   Cardiovascular: Negative for chest pain, palpitations and leg swelling.  Gastrointestinal: Positive for diarrhea and nausea. Negative for abdominal pain.       From metformin  Genitourinary: Negative for difficulty urinating and flank pain.  Musculoskeletal: Negative for arthralgias and back pain.  Skin: Negative.   Neurological: Negative for dizziness and headaches.  Psychiatric/Behavioral: Negative for sleep disturbance.    BP 120/78 (BP Location: Left Arm, Patient Position: Sitting, Cuff Size: Normal)   Pulse 86   Temp (!) 97 F (36.1 C) (Other (Comment))   Resp 16   Ht 5\' 11"  (1.803 m)   Wt 248 lb 4 oz (112.6 kg)   SpO2 95%  BMI 34.62 kg/m   Physical Exam  Constitutional: He is oriented to person, place, and time. He appears well-developed and well-nourished.  HENT:  Head: Normocephalic and atraumatic.  Mouth/Throat: Oropharynx is clear and moist.  Eyes: Pupils are equal, round, and reactive to light. Conjunctivae are normal.  Neck: Normal range of motion. Neck supple.  Cardiovascular: Normal rate, regular rhythm and normal heart sounds.   Pulmonary/Chest: Effort normal and breath sounds normal. No respiratory distress.  Abdominal: Soft. Bowel sounds are normal.  Musculoskeletal: Normal range of motion. He exhibits no edema.  Neurological: He is  alert and oriented to person, place, and time.  Gait normal  Skin: Skin is warm and dry.  Psychiatric: He has a normal mood and affect. His behavior is normal. Thought content normal.  Nursing note and vitals reviewed.   ASSESSMENT/PLAN:  1. Type 2 diabetes mellitus without complication, without long-term current use of insulin (HCC) Controlled.  Some intolerance to metformin.  Will try to rearrange his medicines for better compliance. - BASIC METABOLIC PANEL WITH GFR - Hemoglobin A1c - Lipid panel - Urinalysis, Routine w reflex microscopic - Microalbumin / creatinine urine ratio  2. Essential hypertension Controlled  3. Hyperlipidemia, unspecified hyperlipidemia type Trolled on statin   Patient Instructions  Reduce the metformin to 500 2X a day Increase the glipizide to 5 mg every morning Be careful with diabetic diet Stay active Due for re check and labs in 6 months      Raylene Everts, MD

## 2017-02-21 LAB — BASIC METABOLIC PANEL WITH GFR
BUN: 15 mg/dL (ref 7–25)
CALCIUM: 9.5 mg/dL (ref 8.6–10.3)
CO2: 28 mmol/L (ref 20–32)
Chloride: 101 mmol/L (ref 98–110)
Creat: 1.04 mg/dL (ref 0.60–1.35)
GFR, EST AFRICAN AMERICAN: 97 mL/min/{1.73_m2} (ref >=60)
GFR, EST NON AFRICAN AMERICAN: 84 mL/min/{1.73_m2} (ref >=60)
Glucose, Bld: 172 mg/dL — ABNORMAL HIGH (ref 65–99)
Potassium: 4.5 mmol/L (ref 3.5–5.3)
Sodium: 138 mmol/L (ref 135–146)

## 2017-02-21 LAB — MICROALBUMIN / CREATININE URINE RATIO
CREATININE, URINE: 124 mg/dL (ref 20–320)
MICROALB UR: 1.5 mg/dL
Microalb Creat Ratio: 12 mcg/mg creat (ref <30)

## 2017-02-21 LAB — LIPID PANEL
Cholesterol: 92 mg/dL (ref <200)
HDL: 35 mg/dL — AB (ref >40)
LDL CHOLESTEROL (CALC): 37 mg/dL
NON-HDL CHOLESTEROL (CALC): 57 mg/dL (ref <130)
TRIGLYCERIDES: 123 mg/dL (ref <150)
Total CHOL/HDL Ratio: 2.6 (calc) (ref <5.0)

## 2017-02-21 LAB — URINALYSIS, ROUTINE W REFLEX MICROSCOPIC
BILIRUBIN URINE: NEGATIVE
Glucose, UA: NEGATIVE
Hgb urine dipstick: NEGATIVE
KETONES UR: NEGATIVE
Leukocytes, UA: NEGATIVE
NITRITE: NEGATIVE
PROTEIN: NEGATIVE
SPECIFIC GRAVITY, URINE: 1.02 (ref 1.001–1.03)
pH: 6 (ref 5.0–8.0)

## 2017-02-21 LAB — HEMOGLOBIN A1C
EAG (MMOL/L): 9.8 (calc)
HEMOGLOBIN A1C: 7.8 %{Hb} — AB (ref <5.7)
Mean Plasma Glucose: 177 (calc)

## 2017-03-05 ENCOUNTER — Other Ambulatory Visit: Payer: Self-pay

## 2017-06-30 ENCOUNTER — Other Ambulatory Visit: Payer: Self-pay

## 2017-06-30 ENCOUNTER — Encounter: Payer: Self-pay | Admitting: Family Medicine

## 2017-06-30 MED ORDER — ATORVASTATIN CALCIUM 40 MG PO TABS: 40.0000 mg | ORAL_TABLET | Freq: Every day | ORAL | 3 refills | Status: DC

## 2017-07-30 ENCOUNTER — Other Ambulatory Visit: Payer: Self-pay | Admitting: Internal Medicine

## 2017-07-30 NOTE — Telephone Encounter (Signed)
Left message on his voice mail to call me back.

## 2017-07-30 NOTE — Telephone Encounter (Signed)
May I refill, had a colon 02/2016?

## 2017-07-30 NOTE — Telephone Encounter (Signed)
Please get a symptom update - last seen 2017  Can refill x 1 but would need to get more from pcp or see me

## 2017-07-31 NOTE — Telephone Encounter (Signed)
Left a detailed message on the mobile # (which had his wife Lisa's voice) for them to call me back with a symptom update.

## 2017-08-04 NOTE — Telephone Encounter (Signed)
Spoke with Lattie Haw (wife) and she said Adrian Jacobson wants to keep this on hand . He gets diarrhea spells occasionally and uses it then. If he needs more refills they will call and make an appointment because currently they don't have a PCP. One was unfortunately killed in a motorcycle accident and the one after that resigned. They are looking for a new one.

## 2017-08-26 ENCOUNTER — Ambulatory Visit: Payer: Managed Care, Other (non HMO) | Admitting: Family Medicine

## 2018-03-16 ENCOUNTER — Ambulatory Visit: Payer: Self-pay | Admitting: Family Medicine

## 2018-04-28 ENCOUNTER — Encounter: Payer: Self-pay | Admitting: Family Medicine

## 2018-04-28 ENCOUNTER — Ambulatory Visit (INDEPENDENT_AMBULATORY_CARE_PROVIDER_SITE_OTHER): Payer: 59 | Admitting: Family Medicine

## 2018-04-28 VITALS — BP 134/90 | HR 81 | Temp 96.7°F | Ht 71.0 in | Wt 246.8 lb

## 2018-04-28 DIAGNOSIS — M7552 Bursitis of left shoulder: Secondary | ICD-10-CM

## 2018-04-28 DIAGNOSIS — E782 Mixed hyperlipidemia: Secondary | ICD-10-CM | POA: Diagnosis not present

## 2018-04-28 DIAGNOSIS — E1169 Type 2 diabetes mellitus with other specified complication: Secondary | ICD-10-CM

## 2018-04-28 DIAGNOSIS — I10 Essential (primary) hypertension: Secondary | ICD-10-CM

## 2018-04-28 DIAGNOSIS — M47812 Spondylosis without myelopathy or radiculopathy, cervical region: Secondary | ICD-10-CM

## 2018-04-28 LAB — BAYER DCA HB A1C WAIVED: HB A1C: 10.9 % — AB (ref <7.0)

## 2018-04-28 MED ORDER — MELOXICAM 15 MG PO TABS: 15.0000 mg | ORAL_TABLET | Freq: Every day | ORAL | 0 refills | Status: DC

## 2018-04-28 MED ORDER — GLIPIZIDE ER 5 MG PO TB24: 5.0000 mg | ORAL_TABLET | Freq: Every day | ORAL | 3 refills | Status: DC

## 2018-04-28 MED ORDER — ATORVASTATIN CALCIUM 40 MG PO TABS: 40.0000 mg | ORAL_TABLET | Freq: Every day | ORAL | 3 refills | Status: DC

## 2018-04-28 MED ORDER — METFORMIN HCL ER 500 MG PO TB24: 1000.0000 mg | ORAL_TABLET | Freq: Two times a day (BID) | ORAL | 3 refills | Status: DC

## 2018-04-28 MED ORDER — DICYCLOMINE HCL 20 MG PO TABS: ORAL_TABLET | ORAL | 3 refills | Status: DC

## 2018-04-28 MED ORDER — LISINOPRIL 5 MG PO TABS: 5.0000 mg | ORAL_TABLET | Freq: Every day | ORAL | 3 refills | Status: DC

## 2018-04-28 NOTE — Progress Notes (Signed)
BP 134/90   Pulse 81   Temp (!) 96.7 F (35.9 C) (Oral)   Ht _0  (1.803 m)   Wt 246 lb 12.8 oz (111.9 kg)   BMI 34.42 kg/m    Subjective:    [name], male    DOB: 12-10-1965, 53 y.o.   MRN: [id number]  HPI: Adrian Jacobson is a 53 y.o. male presenting on 04/28/2018 for New Patient (Initial Visit) (Dr. Meda Coffee); Establish Care; and Neck Pain (Patient states he has been having bilateral neck and shoulder pain that has been ongoing.)   HPI Type 2 diabetes mellitus Patient comes in today for recheck of his diabetes. Patient has been currently taking Jardiance and glipizide and metformin has been off of it more recently because he ran out over the past couple weeks. Patient is currently on an ACE inhibitor/ARB. Patient has not seen an ophthalmologist this year. Patient denies any issues with their feet.   Hyperlipidemia Patient is coming in for recheck of his hyperlipidemia. The patient is currently taking atorvastatin. They deny any issues with myalgias or history of liver damage from it. They deny any focal numbness or weakness or chest pain.   Hypertension Patient is currently on lisinopril, and their blood pressure today is 134/90. Patient denies any lightheadedness or dizziness. Patient denies headaches, blurred vision, chest pains, shortness of breath, or weakness. Denies any side effects from medication and is content with current medication.   Neck pain and left shoulder pain Patient has both bilateral neck pain and left shoulder pain that is been ongoing for at least the past couple months.  He has been having more irritation over the past couple weeks and that is part of what brought him in as a new patient here to our office today.   Patient says the pain is moderate in intensity and does keep him up at night sometimes.  He had been using some Tylenol for it but nothing else to this point.  He does not state his benefit he wants referral for it but just wants  to know if there is something that can be done for it.  Relevant past medical, surgical, family and social history reviewed and updated as indicated. Interim medical history since our last visit reviewed. Allergies and medications reviewed and updated.  Review of Systems  Constitutional: Negative for chills and fever.  HENT: Negative for ear pain and tinnitus.   Eyes: Negative for pain and discharge.  Respiratory: Negative for cough, shortness of breath and wheezing.   Cardiovascular: Negative for chest pain, palpitations and leg swelling.  Gastrointestinal: Negative for abdominal pain, blood in stool, constipation and diarrhea.  Genitourinary: Negative for dysuria and hematuria.  Musculoskeletal: Positive for arthralgias and neck pain. Negative for back pain, gait problem and myalgias.  Skin: Negative for rash.  Neurological: Negative for dizziness, weakness and headaches.  Psychiatric/Behavioral: Negative for suicidal ideas.  All other systems reviewed and are negative.   Per HPI unless specifically indicated above  Social History   Socioeconomic History  . Marital status: Married    Spouse name: tracy  . Number of children: 2  . Years of education: 27  . Highest education level: Not on file  Occupational History  . Occupation: supervisor-maintenance  Social Needs  . Financial resource strain: Not on file  . Food insecurity:    Worry: Not on file    Inability: Not on file  . Transportation needs:    Medical: Not  on file    Non-medical: Not on file  Tobacco Use  . Smoking status: Never Smoker  . Smokeless tobacco: Never Used  Substance and Sexual Activity  . Alcohol use: No  . Drug use: No  . Sexual activity: Yes    Birth control/protection: Surgical  Lifestyle  . Physical activity:    Days per week: Not on file    Minutes per session: Not on file  . Stress: Not on file  Relationships  . Social connections:    Talks on phone: Not on file    Gets together: Not  on file    Attends religious service: Not on file    Active member of club or organization: Not on file    Attends meetings of clubs or organizations: Not on file    Relationship status: Not on file  . Intimate partner violence:    Fear of current or ex partner: Not on file    Emotionally abused: Not on file    Physically abused: Not on file    Forced sexual activity: Not on file  Other Topics Concern  . 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   3 sons one daughter, married ( 2 children and two step)   No caffeine no alcohol no tobacco no drugs   Likes computers, farming, outdoors    Past Surgical History:  Procedure Laterality Date  . CARDIAC CATHETERIZATION N/A 11/29/2015   Procedure: Left Heart Cath and Coronary Angiography;  Surgeon: Belva Crome, MD;  Location: [facility];  Service: Cardiovascular;  Laterality: N/A;  . STONE EXTRACTION WITH BASKET    . TESTICLE SURGERY      Family History  Problem Relation Age of Onset  . COPD Mother   . Diabetes Mother   . Hyperlipidemia Mother   . Hypertension Father   . Colon cancer Maternal Uncle 34  . Heart disease Sister     Allergies as of 04/28/2018      Reactions   Strawberry Flavor Anaphylaxis   Anything with the fruit   Hydrocodone Other (See Comments)   Mood swings    Niacin And Related Other (See Comments)   Feel like skin burning    Omeprazole Other (See Comments)   Abdominal pain      Medication List       Accurate as of April 28, 2018 10:27 AM. Always use your most recent med list.        atorvastatin 40 MG tablet Commonly known as:  LIPITOR Take 1 tablet (40 mg total) by mouth daily.   dicyclomine 20 MG tablet Commonly known as:  BENTYL TAKE 1 TABLET BY MOUTH EVERY 6 HOURS AS NEEDED FOR SPASMS (ABDOMINAL PAIN).   glipiZIDE 5 MG 24 hr tablet Commonly known as:  GLUCOTROL XL Take 1 tablet (5 mg total) by mouth daily with  breakfast.   glucose blood test strip Use as instructed   Lancets Misc Use as directed to check blood sugar twice daily   lisinopril 5 MG tablet Commonly known as:  PRINIVIL,ZESTRIL Take 1 tablet (5 mg total) by mouth daily.   meloxicam 15 MG tablet Commonly known as:  MOBIC Take 1 tablet (15 mg total) by mouth daily.   metFORMIN 500 MG 24 hr tablet Commonly known as:  GLUCOPHAGE-XR Take 2 tablets (1,000 mg total) by mouth 2 (two) times daily.  Objective:    BP 134/90   Pulse 81   Temp (!) 96.7 F (35.9 C) (Oral)   Ht _0  (1.803 m)   Wt 246 lb 12.8 oz (111.9 kg)   BMI 34.42 kg/m   Wt Readings from Last 3 Encounters:  04/28/18 246 lb 12.8 oz (111.9 kg)  02/20/17 248 lb 4 oz (112.6 kg)  01/02/17 250 lb (113.4 kg)    Physical Exam Vitals signs and nursing note reviewed.  Constitutional:      General: He is not in acute distress.    Appearance: He is well-developed. He is not diaphoretic.  Eyes:     General: No scleral icterus.       Right eye: No discharge.     Conjunctiva/sclera: Conjunctivae normal.     Pupils: Pupils are equal, round, and reactive to light.  Neck:     Musculoskeletal: Neck supple.     Thyroid: No thyromegaly.  Cardiovascular:     Rate and Rhythm: Normal rate and regular rhythm.     Heart sounds: Normal heart sounds. No murmur.  Pulmonary:     Effort: Pulmonary effort is normal. No respiratory distress.     Breath sounds: Normal breath sounds. No wheezing.  Musculoskeletal: Normal range of motion.     Left shoulder: He exhibits tenderness (Tenderness and pain with Hawkins impingement sign, negative rotator cuff testing). He exhibits normal range of motion and no deformity.     Cervical back: He exhibits tenderness (Bilateral paraspinal muscular tenderness of the neck). He exhibits normal range of motion, no bony tenderness and no swelling.  Lymphadenopathy:     Cervical: No cervical adenopathy.  Skin:    General: Skin is warm  and dry.     Findings: No rash.  Neurological:     Mental Status: He is alert and oriented to person, place, and time.     Coordination: Coordination normal.  Psychiatric:        Behavior: Behavior normal.         Assessment & Plan:   Problem List Items Addressed This Visit      Cardiovascular and Mediastinum   Essential hypertension   Relevant Medications   lisinopril (PRINIVIL,ZESTRIL) 5 MG tablet   atorvastatin (LIPITOR) 40 MG tablet     Endocrine   Type 2 diabetes mellitus (HCC) - Primary   Relevant Medications   glipiZIDE (GLUCOTROL XL) 5 MG 24 hr tablet   lisinopril (PRINIVIL,ZESTRIL) 5 MG tablet   metFORMIN (GLUCOPHAGE-XR) 500 MG 24 hr tablet   atorvastatin (LIPITOR) 40 MG tablet   Other Relevant Orders   Bayer DCA Hb A1c Waived   CBC with Differential/Platelet   CMP14+EGFR   Microalbumin / creatinine urine ratio     Other   HLD (hyperlipidemia)   Relevant Medications   lisinopril (PRINIVIL,ZESTRIL) 5 MG tablet   atorvastatin (LIPITOR) 40 MG tablet   Other Relevant Orders   Lipid panel    Other Visit Diagnoses    Neck arthritis       Relevant Medications   meloxicam (MOBIC) 15 MG tablet   Acute bursitis of left shoulder          Will manage bursitis and arthritis conservatively with meloxicam.  We will continue his previous medications metformin and glipizide lisinopril and atorvastatin and do labs and then follow-up in 3 months. Follow up plan: Return in about 3 months (around 07/28/2018), or if symptoms worsen or fail to improve, for type 2 diabetes and htn.  Caryl Pina, MD [facility] 04/28/2018, 10:27 AM

## 2018-04-29 LAB — CBC WITH DIFFERENTIAL/PLATELET
BASOS ABS: 0.1 10*3/uL (ref 0.0–0.2)
Basos: 1 %
EOS (ABSOLUTE): 0.7 10*3/uL — ABNORMAL HIGH (ref 0.0–0.4)
Eos: 9 %
Hematocrit: 42.3 % (ref 37.5–51.0)
Hemoglobin: 14.7 g/dL (ref 13.0–17.7)
IMMATURE GRANS (ABS): 0.2 10*3/uL — AB (ref 0.0–0.1)
Immature Granulocytes: 2 %
LYMPHS: 30 %
Lymphocytes Absolute: 2.4 10*3/uL (ref 0.7–3.1)
MCH: 28.7 pg (ref 26.6–33.0)
MCHC: 34.8 g/dL (ref 31.5–35.7)
MCV: 83 fL (ref 79–97)
MONOCYTES: 7 %
Monocytes Absolute: 0.5 10*3/uL (ref 0.1–0.9)
NEUTROS PCT: 51 %
Neutrophils Absolute: 4.1 10*3/uL (ref 1.4–7.0)
Platelets: 308 10*3/uL (ref 150–450)
RBC: 5.12 x10E6/uL (ref 4.14–5.80)
RDW: 12.4 % (ref 11.6–15.4)
WBC: 8 10*3/uL (ref 3.4–10.8)

## 2018-04-29 LAB — LIPID PANEL
CHOLESTEROL TOTAL: 105 mg/dL (ref 100–199)
Chol/HDL Ratio: 3.5 ratio (ref 0.0–5.0)
HDL: 30 mg/dL — ABNORMAL LOW (ref >39)
LDL CALC: 33 mg/dL (ref 0–99)
Triglycerides: 210 mg/dL — ABNORMAL HIGH (ref 0–149)
VLDL CHOLESTEROL CAL: 42 mg/dL — AB (ref 5–40)

## 2018-04-29 LAB — CMP14+EGFR
ALT: 51 IU/L — ABNORMAL HIGH (ref 0–44)
AST: 36 IU/L (ref 0–40)
Albumin/Globulin Ratio: 1.5 (ref 1.2–2.2)
Albumin: 4.5 g/dL (ref 3.5–5.5)
Alkaline Phosphatase: 69 IU/L (ref 39–117)
BILIRUBIN TOTAL: 0.4 mg/dL (ref 0.0–1.2)
BUN / CREAT RATIO: 14 (ref 9–20)
BUN: 16 mg/dL (ref 6–24)
CO2: 22 mmol/L (ref 20–29)
CREATININE: 1.12 mg/dL (ref 0.76–1.27)
Calcium: 9.9 mg/dL (ref 8.7–10.2)
Chloride: 96 mmol/L (ref 96–106)
GFR calc non Af Amer: 76 mL/min/{1.73_m2} (ref >59)
GFR, EST AFRICAN AMERICAN: 88 mL/min/{1.73_m2} (ref >59)
GLOBULIN, TOTAL: 3 g/dL (ref 1.5–4.5)
GLUCOSE: 241 mg/dL — AB (ref 65–99)
Potassium: 5.1 mmol/L (ref 3.5–5.2)
SODIUM: 136 mmol/L (ref 134–144)
Total Protein: 7.5 g/dL (ref 6.0–8.5)

## 2018-04-29 LAB — MICROALBUMIN / CREATININE URINE RATIO
CREATININE, UR: 171.9 mg/dL
Microalb/Creat Ratio: 23.4 mg/g creat (ref 0.0–30.0)
Microalbumin, Urine: 40.2 ug/mL

## 2018-04-29 MED ORDER — EMPAGLIFLOZIN 10 MG PO TABS: 10.0000 mg | ORAL_TABLET | Freq: Every day | ORAL | 1 refills | Status: DC

## 2018-05-10 ENCOUNTER — Encounter: Payer: Self-pay | Admitting: Family Medicine

## 2018-05-11 MED ORDER — GLIPIZIDE ER 10 MG PO TB24: 10.0000 mg | ORAL_TABLET | Freq: Two times a day (BID) | ORAL | 3 refills | Status: DC

## 2018-07-01 ENCOUNTER — Other Ambulatory Visit: Payer: Self-pay | Admitting: Family Medicine

## 2018-07-24 ENCOUNTER — Other Ambulatory Visit: Payer: Self-pay | Admitting: Family Medicine

## 2018-07-30 ENCOUNTER — Ambulatory Visit: Payer: 59 | Admitting: Family Medicine

## 2018-08-03 ENCOUNTER — Encounter: Payer: Self-pay | Admitting: Family Medicine

## 2018-08-04 ENCOUNTER — Ambulatory Visit (INDEPENDENT_AMBULATORY_CARE_PROVIDER_SITE_OTHER): Payer: Managed Care, Other (non HMO) | Admitting: Physician Assistant

## 2018-08-04 ENCOUNTER — Encounter: Payer: Self-pay | Admitting: Physician Assistant

## 2018-08-04 ENCOUNTER — Other Ambulatory Visit: Payer: Self-pay

## 2018-08-04 DIAGNOSIS — M5412 Radiculopathy, cervical region: Secondary | ICD-10-CM

## 2018-08-04 DIAGNOSIS — M542 Cervicalgia: Secondary | ICD-10-CM | POA: Diagnosis not present

## 2018-08-04 DIAGNOSIS — G8929 Other chronic pain: Secondary | ICD-10-CM

## 2018-08-04 HISTORY — DX: Other chronic pain: G89.29

## 2018-08-04 MED ORDER — BACLOFEN 10 MG PO TABS: 10.0000 mg | ORAL_TABLET | Freq: Three times a day (TID) | ORAL | 0 refills | Status: DC

## 2018-08-04 MED ORDER — PREDNISONE 10 MG (21) PO TBPK: ORAL_TABLET | ORAL | 0 refills | Status: DC

## 2018-08-04 NOTE — Progress Notes (Signed)
Telephone visit  Subjective: CC: Neck and shoulder pain PCP: Dettinger, Fransisca Kaufmann, MD [id number] D Wichman is a 53 y.o. male calls for telephone consult today. Patient provides verbal consent for consult held via phone.  Patient is identified with 2 separate identifiers.  At this time the entire area is on COVID-19 social distancing and stay home orders are in place.  Patient is of higher risk and therefore we are performing this by a virtual method.  Location of patient: Work Location of provider: [facility] Others present for call: No  Back in January the patient was experiencing primarily shoulder pain with some neck pain.  However for the last 2 days he has had severe pain in the neck it does go down both arms and even over the right shoulder and arm.  It seems radicular in nature.  It wakes him up at night that the pain is so severe.  He has been taking Mobic 15 mg 1 daily since January.  He states that this is not helping at all.  He has not had an x-ray or anything performed on his neck or shoulders.  He states that about 10 years ago he had an injection placed in his right shoulder but that it calm things down for a long time.   ROS: Per HPI  Allergies  Allergen Reactions  . Strawberry Flavor Anaphylaxis    Anything with the fruit  . Hydrocodone Other (See Comments)    Mood swings   . Niacin And Related Other (See Comments)    Feel like skin burning   . Omeprazole Other (See Comments)    Abdominal pain   Past Medical History:  Diagnosis Date  . Allergy   . Anxiety   . Asthma   . Chronic kidney disease    stones  . Chronic radicular cervical pain 08/04/2018  . Depression   . Diabetes mellitus without complication ([place])   . Essential hypertension 01/02/2017  . GERD (gastroesophageal reflux disease)   . HLD (hyperlipidemia) 01/02/2017  . Sleep apnea    no CPAP    Current Outpatient Medications:  .  atorvastatin (LIPITOR) 40 MG tablet, Take 1 tablet (40 mg total) by mouth  daily., Disp: 90 tablet, Rfl: 3 .  baclofen (LIORESAL) 10 MG tablet, Take 1 tablet (10 mg total) by mouth 3 (three) times daily., Disp: 60 each, Rfl: 0 .  dicyclomine (BENTYL) 20 MG tablet, TAKE 1 TABLET BY MOUTH EVERY 6 HOURS AS NEEDED FOR SPASMS (ABDOMINAL PAIN)., Disp: 270 tablet, Rfl: 3 .  glipiZIDE (GLUCOTROL XL) 10 MG 24 hr tablet, Take 1 tablet (10 mg total) by mouth 2 (two) times daily., Disp: 180 tablet, Rfl: 3 .  glucose blood test strip, Use as instructed, Disp: 100 each, Rfl: 0 .  Lancets MISC, Use as directed to check blood sugar twice daily, Disp: 100 each, Rfl: 0 .  lisinopril (PRINIVIL,ZESTRIL) 5 MG tablet, Take 1 tablet (5 mg total) by mouth daily., Disp: 90 tablet, Rfl: 3 .  meloxicam (MOBIC) 15 MG tablet, TAKE 1 TABLET BY MOUTH EVERY DAY, Disp: 30 tablet, Rfl: 0 .  metFORMIN (GLUCOPHAGE-XR) 500 MG 24 hr tablet, Take 2 tablets (1,000 mg total) by mouth 2 (two) times daily., Disp: 360 tablet, Rfl: 3 .  predniSONE (STERAPRED UNI-PAK 21 TAB) 10 MG (21) TBPK tablet, As directed x 6 days, Disp: 21 tablet, Rfl: 0  Assessment/ Plan: 53 y.o. male   1. Chronic radicular cervical pain - baclofen (LIORESAL) 10 MG  tablet; Take 1 tablet (10 mg total) by mouth 3 (three) times daily.  Dispense: 60 each; Refill: 0 - predniSONE (STERAPRED UNI-PAK 21 TAB) 10 MG (21) TBPK tablet; As directed x 6 days  Dispense: 21 tablet; Refill: 0  2. Cervical pain - baclofen (LIORESAL) 10 MG tablet; Take 1 tablet (10 mg total) by mouth 3 (three) times daily.  Dispense: 60 each; Refill: 0 - predniSONE (STERAPRED UNI-PAK 21 TAB) 10 MG (21) TBPK tablet; As directed x 6 days  Dispense: 21 tablet; Refill: 0  I have asked him to call the office back and get an appointment with Dr. Warrick Parisian in approximately 2 weeks.  They should be able to do it by telephone if we are still on restricted orders.  Start time: 9:15 AM End time: 9:24 AM  Meds ordered this encounter  Medications  . baclofen (LIORESAL) 10 MG  tablet    Sig: Take 1 tablet (10 mg total) by mouth 3 (three) times daily.    Dispense:  60 each    Refill:  0    Order Specific Question:   Supervising Provider    Answer:   Janora Norlander [[id number]]  . predniSONE (STERAPRED UNI-PAK 21 TAB) 10 MG (21) TBPK tablet    Sig: As directed x 6 days    Dispense:  21 tablet    Refill:  0    Order Specific Question:   Supervising Provider    Answer:   Janora Norlander [[id number]]    Particia Nearing PA-C [place] [phone]

## 2018-08-12 ENCOUNTER — Encounter: Payer: Self-pay | Admitting: Family Medicine

## 2018-08-12 ENCOUNTER — Ambulatory Visit (INDEPENDENT_AMBULATORY_CARE_PROVIDER_SITE_OTHER): Payer: Managed Care, Other (non HMO)

## 2018-08-12 ENCOUNTER — Other Ambulatory Visit: Payer: Self-pay

## 2018-08-12 ENCOUNTER — Ambulatory Visit (INDEPENDENT_AMBULATORY_CARE_PROVIDER_SITE_OTHER): Payer: Managed Care, Other (non HMO) | Admitting: Family Medicine

## 2018-08-12 VITALS — BP 127/86 | HR 88 | Temp 97.5°F | Ht 71.0 in | Wt 244.4 lb

## 2018-08-12 DIAGNOSIS — M5412 Radiculopathy, cervical region: Secondary | ICD-10-CM | POA: Diagnosis not present

## 2018-08-12 MED ORDER — METHYLPREDNISOLONE ACETATE 80 MG/ML IJ SUSP
80.0000 mg | Freq: Once | INTRAMUSCULAR | Status: AC
  Administered 2018-08-12: 80 mg via INTRAMUSCULAR

## 2018-08-12 NOTE — Progress Notes (Signed)
BP 127/86   Pulse 88   Temp (!) 97.5 F (36.4 C) (Oral)   Ht 5\' 11"  (1.803 m)   Wt 244 lb 6.4 oz (110.9 kg)   BMI 34.09 kg/m    Subjective:   [name], male    DOB: 1965/11/20, 53 y.o.   MRN: [id number]  HPI: Adrian Jacobson is a 53 y.o. male presenting on 08/12/2018 for Shoulder Pain (right- patient was seen 4/14 and states he is no better)   HPI Right neck and shoulder pain Patient comes in complaining of right neck and shoulder pain that is been going on over the past week.  He does work in a job where he uses his arm and his upper body quite extensively but he cannot recall any specific inciting incident that brought this up on him.  He says the pain is worse right over his right shoulder blade then extends up into his right neck and down into his right upper arm and yesterday it got worse to where he was even feeling in his right chest a little bit.  He denies any fevers or chills or shortness of breath or palpitations.  He says that the pain is improved by keeping his arm up over his head and stretching.  Occasionally the pain has been waking him up at night as well.  He rates the pain is moderate to severe.  He has been using ibuprofen which does not seem to help and then was given a course of prednisone which helped some and then he has been using a muscle relaxer which has not helped much.  He denies any numbness or weakness but just more the shooting pain.  Relevant past medical, surgical, family and social history reviewed and updated as indicated. Interim medical history since our last visit reviewed. Allergies and medications reviewed and updated.  Review of Systems  Constitutional: Negative for chills and fever.  Respiratory: Negative for shortness of breath and wheezing.   Cardiovascular: Negative for chest pain and leg swelling.  Musculoskeletal: Positive for arthralgias, myalgias and neck pain. Negative for back pain, gait problem and neck stiffness.   Skin: Negative for color change, rash and wound.  Neurological: Negative for weakness and numbness.  All other systems reviewed and are negative.   Per HPI unless specifically indicated above   Allergies as of 08/12/2018      Reactions   Strawberry Flavor Anaphylaxis   Anything with the fruit   Hydrocodone Other (See Comments)   Mood swings    Niacin And Related Other (See Comments)   Feel like skin burning    Omeprazole Other (See Comments)   Abdominal pain      Medication List       Accurate as of August 12, 2018  4:14 PM. Always use your most recent med list.        atorvastatin 40 MG tablet Commonly known as:  LIPITOR Take 1 tablet (40 mg total) by mouth daily.   baclofen 10 MG tablet Commonly known as:  LIORESAL Take 1 tablet (10 mg total) by mouth 3 (three) times daily.   dicyclomine 20 MG tablet Commonly known as:  BENTYL TAKE 1 TABLET BY MOUTH EVERY 6 HOURS AS NEEDED FOR SPASMS (ABDOMINAL PAIN).   glipiZIDE 10 MG 24 hr tablet Commonly known as:  GLUCOTROL XL Take 1 tablet (10 mg total) by mouth 2 (two) times daily.   glucose blood test strip Use as instructed  Lancets Misc Use as directed to check blood sugar twice daily   lisinopril 5 MG tablet Commonly known as:  ZESTRIL Take 1 tablet (5 mg total) by mouth daily.   meloxicam 15 MG tablet Commonly known as:  MOBIC TAKE 1 TABLET BY MOUTH EVERY DAY   metFORMIN 500 MG 24 hr tablet Commonly known as:  GLUCOPHAGE-XR Take 2 tablets (1,000 mg total) by mouth 2 (two) times daily.        Objective:   BP 127/86   Pulse 88   Temp (!) 97.5 F (36.4 C) (Oral)   Ht 5\' 11"  (1.803 m)   Wt 244 lb 6.4 oz (110.9 kg)   BMI 34.09 kg/m   Wt Readings from Last 3 Encounters:  08/12/18 244 lb 6.4 oz (110.9 kg)  04/28/18 246 lb 12.8 oz (111.9 kg)  02/20/17 248 lb 4 oz (112.6 kg)    Physical Exam Vitals signs and nursing note reviewed.  Constitutional:      General: He is not in acute distress.     Appearance: He is well-developed. He is not diaphoretic.  Eyes:     General: No scleral icterus.    Conjunctiva/sclera: Conjunctivae normal.  Neck:     Musculoskeletal: Neck supple.     Thyroid: No thyromegaly.  Cardiovascular:     Rate and Rhythm: Normal rate and regular rhythm.     Heart sounds: Normal heart sounds. No murmur.  Pulmonary:     Effort: Pulmonary effort is normal. No respiratory distress.     Breath sounds: Normal breath sounds. No wheezing.  Musculoskeletal: Normal range of motion.     Right shoulder: He exhibits tenderness (Pain worse in the posterior shoulder and scapula with cross body motion but improved with overhead motion of the arm, rotator cuff testing negative) and pain. He exhibits normal range of motion, no crepitus, no deformity and normal strength.     Cervical back: He exhibits tenderness. He exhibits normal range of motion, no bony tenderness, no deformity and no spasm.       Back:  Lymphadenopathy:     Cervical: No cervical adenopathy.  Skin:    General: Skin is warm and dry.     Findings: No rash.  Neurological:     Mental Status: He is alert and oriented to person, place, and time.     Coordination: Coordination normal.  Psychiatric:        Behavior: Behavior normal.       Assessment & Plan:   Problem List Items Addressed This Visit    None    Visit Diagnoses    Cervical radiculopathy    -  Primary   Likely muscular in origin, x-ray is normal, will give Depakote, watch sugars closely   Relevant Medications   methylPREDNISolone acetate (DEPO-MEDROL) injection 80 mg (Completed) (Start on 08/12/2018  4:15 PM)   Other Relevant Orders   DG Cervical Spine Complete   Ambulatory referral to Physical Therapy      Recommend using his meloxicam and a muscle relaxer and Tylenol and then going to physical therapy Follow up plan: Return if symptoms worsen or fail to improve.  Counseling provided for all of the vaccine components Orders Placed  This Encounter  Procedures  . DG Cervical Spine Complete  . Ambulatory referral to Physical Therapy    Caryl Pina, MD [facility] 08/12/2018, 4:14 PM

## 2018-08-18 ENCOUNTER — Encounter: Payer: Self-pay | Admitting: Family Medicine

## 2018-08-18 DIAGNOSIS — M542 Cervicalgia: Secondary | ICD-10-CM

## 2018-08-18 DIAGNOSIS — G8929 Other chronic pain: Secondary | ICD-10-CM

## 2018-08-18 DIAGNOSIS — M5412 Radiculopathy, cervical region: Secondary | ICD-10-CM

## 2018-08-19 ENCOUNTER — Telehealth: Payer: Self-pay | Admitting: Family Medicine

## 2018-08-19 NOTE — Telephone Encounter (Signed)
Aware.  Message is in provider's in box and will be addressed soon.

## 2018-08-19 NOTE — Telephone Encounter (Signed)
Second call patient go back to work or not?

## 2018-08-20 NOTE — Telephone Encounter (Signed)
I think he responded now, I just saw this, I cant remember what it was about now.

## 2018-08-24 ENCOUNTER — Emergency Department (HOSPITAL_COMMUNITY): Payer: Managed Care, Other (non HMO)

## 2018-08-24 ENCOUNTER — Encounter (HOSPITAL_COMMUNITY): Payer: Self-pay

## 2018-08-24 ENCOUNTER — Emergency Department (HOSPITAL_COMMUNITY)
Admission: EM | Admit: 2018-08-24 | Discharge: 2018-08-24 | Disposition: A | Discharge status: Home / Self Care | Payer: Managed Care, Other (non HMO) | Attending: Emergency Medicine | Admitting: Emergency Medicine

## 2018-08-24 ENCOUNTER — Other Ambulatory Visit: Payer: Self-pay

## 2018-08-24 ENCOUNTER — Encounter: Payer: Self-pay | Admitting: Family Medicine

## 2018-08-24 ENCOUNTER — Ambulatory Visit (INDEPENDENT_AMBULATORY_CARE_PROVIDER_SITE_OTHER): Payer: Managed Care, Other (non HMO) | Admitting: Family Medicine

## 2018-08-24 DIAGNOSIS — I10 Essential (primary) hypertension: Secondary | ICD-10-CM | POA: Diagnosis not present

## 2018-08-24 DIAGNOSIS — R252 Cramp and spasm: Secondary | ICD-10-CM | POA: Diagnosis not present

## 2018-08-24 DIAGNOSIS — E119 Type 2 diabetes mellitus without complications: Secondary | ICD-10-CM | POA: Insufficient documentation

## 2018-08-24 DIAGNOSIS — R197 Diarrhea, unspecified: Secondary | ICD-10-CM | POA: Insufficient documentation

## 2018-08-24 DIAGNOSIS — R002 Palpitations: Secondary | ICD-10-CM | POA: Diagnosis not present

## 2018-08-24 DIAGNOSIS — Z7984 Long term (current) use of oral hypoglycemic drugs: Secondary | ICD-10-CM | POA: Insufficient documentation

## 2018-08-24 DIAGNOSIS — J45909 Unspecified asthma, uncomplicated: Secondary | ICD-10-CM | POA: Insufficient documentation

## 2018-08-24 DIAGNOSIS — Z79899 Other long term (current) drug therapy: Secondary | ICD-10-CM | POA: Diagnosis not present

## 2018-08-24 LAB — CBC WITH DIFFERENTIAL/PLATELET
Abs Immature Granulocytes: 0.02 10*3/uL (ref 0.00–0.07)
Basophils Absolute: 0.1 10*3/uL (ref 0.0–0.1)
Basophils Relative: 1 %
Eosinophils Absolute: 0.4 10*3/uL (ref 0.0–0.5)
Eosinophils Relative: 6 %
HCT: 42.4 % (ref 39.0–52.0)
Hemoglobin: 14.3 g/dL (ref 13.0–17.0)
Immature Granulocytes: 0 %
Lymphocytes Relative: 22 %
Lymphs Abs: 1.5 10*3/uL (ref 0.7–4.0)
MCH: 28.9 pg (ref 26.0–34.0)
MCHC: 33.7 g/dL (ref 30.0–36.0)
MCV: 85.8 fL (ref 80.0–100.0)
Monocytes Absolute: 0.6 10*3/uL (ref 0.1–1.0)
Monocytes Relative: 9 %
Neutro Abs: 4.3 10*3/uL (ref 1.7–7.7)
Neutrophils Relative %: 62 %
Platelets: 244 10*3/uL (ref 150–400)
RBC: 4.94 MIL/uL (ref 4.22–5.81)
RDW: 12.8 % (ref 11.5–15.5)
WBC: 6.9 10*3/uL (ref 4.0–10.5)
nRBC: 0 % (ref 0.0–0.2)

## 2018-08-24 LAB — TROPONIN I: Troponin I: <0.03 ng/mL (ref <0.03)

## 2018-08-24 LAB — COMPREHENSIVE METABOLIC PANEL
ALT: 39 U/L (ref 0–44)
AST: 29 U/L (ref 15–41)
Albumin: 4.1 g/dL (ref 3.5–5.0)
Alkaline Phosphatase: 61 U/L (ref 38–126)
Anion gap: 13 (ref 5–15)
BUN: 18 mg/dL (ref 6–20)
CO2: 21 mmol/L — ABNORMAL LOW (ref 22–32)
Calcium: 9.5 mg/dL (ref 8.9–10.3)
Chloride: 103 mmol/L (ref 98–111)
Creatinine, Ser: 0.97 mg/dL (ref 0.61–1.24)
GFR calc Af Amer: >60 mL/min (ref >60)
GFR calc non Af Amer: >60 mL/min (ref >60)
Glucose, Bld: 222 mg/dL — ABNORMAL HIGH (ref 70–99)
Potassium: 4.1 mmol/L (ref 3.5–5.1)
Sodium: 137 mmol/L (ref 135–145)
Total Bilirubin: 0.5 mg/dL (ref 0.3–1.2)
Total Protein: 7.7 g/dL (ref 6.5–8.1)

## 2018-08-24 LAB — URINALYSIS, ROUTINE W REFLEX MICROSCOPIC
Bilirubin Urine: NEGATIVE
Glucose, UA: 150 mg/dL — AB
Hgb urine dipstick: NEGATIVE
Ketones, ur: NEGATIVE mg/dL
Leukocytes,Ua: NEGATIVE
Nitrite: NEGATIVE
Protein, ur: NEGATIVE mg/dL
Specific Gravity, Urine: 1.014 (ref 1.005–1.030)
pH: 5 (ref 5.0–8.0)

## 2018-08-24 MED ORDER — SODIUM CHLORIDE 0.9 % IV BOLUS
1000.0000 mL | Freq: Once | INTRAVENOUS | Status: AC
  Administered 2018-08-24: 1000 mL via INTRAVENOUS

## 2018-08-24 NOTE — ED Provider Notes (Signed)
[facility] EMERGENCY DEPARTMENT Provider Note   CSN: [id number] Arrival date & time: 08/24/18  1348    History   Chief Complaint Chief Complaint  Patient presents with  . Diarrhea  . Palpitations    HPI Adrian Jacobson is a 53 y.o. male.     HPI   The patient presents for evaluation of diarrhea, muscle cramps and palpitations.  He had a virtual visit with his PCP today, and reported several days of diarrhea, generalized muscle cramping, and rapid heartbeat.  He has not had any fever, chills or altered urination.  He continues to drink water.  He was able to eat lunch prior to coming here without any problem.  He feels like he has been eating okay.  He has been working outside a lot, and wonders if that might be causing the problem.  Does not have any known sick contacts.  He had a heart catheterization, and was told it was normal, relatively recently.  There are no other known modifying factors.  Past Medical History:  Diagnosis Date  . Allergy   . Anxiety   . Asthma   . Chronic kidney disease    stones  . Chronic radicular cervical pain 08/04/2018  . Depression   . Diabetes mellitus without complication ([place])   . Essential hypertension 01/02/2017  . GERD (gastroesophageal reflux disease)   . HLD (hyperlipidemia) 01/02/2017  . Sleep apnea    no CPAP    Patient Active Problem List   Diagnosis Date Noted  . Chronic radicular cervical pain 08/04/2018  . Cervical pain 08/04/2018  . Type 2 diabetes mellitus ([place]) 01/02/2017  . Essential hypertension 01/02/2017  . HLD (hyperlipidemia) 01/02/2017  . Chronic GERD 01/02/2017  . Kidney stones 01/02/2017  . OSA (obstructive sleep apnea) 01/02/2017    Past Surgical History:  Procedure Laterality Date  . CARDIAC CATHETERIZATION N/A 11/29/2015   Procedure: Left Heart Cath and Coronary Angiography;  Surgeon: Belva Crome, MD;  Location: [place] [facility];  Service: Cardiovascular;  Laterality: N/A;  . STONE EXTRACTION WITH  BASKET    . TESTICLE SURGERY          Home Medications    Prior to Admission medications   Medication Sig Start Date End Date Taking? Authorizing Provider  atorvastatin (LIPITOR) 40 MG tablet Take 1 tablet (40 mg total) by mouth daily. 04/28/18  Yes Dettinger, Fransisca Kaufmann, MD  baclofen (LIORESAL) 10 MG tablet Take 1 tablet (10 mg total) by mouth 3 (three) times daily. 08/04/18  Yes Terald Sleeper, PA-C  dicyclomine (BENTYL) 20 MG tablet TAKE 1 TABLET BY MOUTH EVERY 6 HOURS AS NEEDED FOR SPASMS (ABDOMINAL PAIN). Patient taking differently: Take 20 mg by mouth every 6 (six) hours as needed for spasms. SPASMS (ABDOMINAL PAIN). 04/28/18  Yes Dettinger, Fransisca Kaufmann, MD  glipiZIDE (GLUCOTROL XL) 10 MG 24 hr tablet Take 1 tablet (10 mg total) by mouth 2 (two) times daily. 05/11/18  Yes Dettinger, Fransisca Kaufmann, MD  lisinopril (PRINIVIL,ZESTRIL) 5 MG tablet Take 1 tablet (5 mg total) by mouth daily. 04/28/18  Yes Dettinger, Fransisca Kaufmann, MD  meloxicam (MOBIC) 15 MG tablet TAKE 1 TABLET BY MOUTH EVERY DAY Patient taking differently: Take 15 mg by mouth daily.  07/24/18  Yes Dettinger, Fransisca Kaufmann, MD  metFORMIN (GLUCOPHAGE-XR) 500 MG 24 hr tablet Take 2 tablets (1,000 mg total) by mouth 2 (two) times daily. 04/28/18 08/24/18 Yes Dettinger, Fransisca Kaufmann, MD    Family History Family History  Problem Relation Age of Onset  . COPD Mother   . Diabetes Mother   . Hyperlipidemia Mother   . Hypertension Father   . Colon cancer Maternal Uncle 44  . Heart disease Sister     Social History Social History   Tobacco Use  . Smoking status: Never Smoker  . Smokeless tobacco: Never Used  Substance Use Topics  . Alcohol use: No  . Drug use: No     Allergies   Strawberry flavor; Hydrocodone; Niacin and related; and Omeprazole   Review of Systems Review of Systems  All other systems reviewed and are negative.    Physical Exam Updated Vital Signs BP 126/83   Pulse 76   Temp 97.7 F (36.5 C) (Oral)   Resp 12   Ht 5\' 11"   (1.803 m)   Wt 106.6 kg   SpO2 99%   BMI 32.78 kg/m   Physical Exam Vitals signs and nursing note reviewed.  Constitutional:      General: He is not in acute distress.    Appearance: Normal appearance. He is well-developed. He is obese. He is not ill-appearing, toxic-appearing or diaphoretic.  HENT:     Head: Normocephalic and atraumatic.     Right Ear: External ear normal.     Left Ear: External ear normal.     Nose: No congestion or rhinorrhea.     Mouth/Throat:     Mouth: Mucous membranes are moist.     Pharynx: No oropharyngeal exudate or posterior oropharyngeal erythema.  Eyes:     Conjunctiva/sclera: Conjunctivae normal.     Pupils: Pupils are equal, round, and reactive to light.  Neck:     Musculoskeletal: Normal range of motion and neck supple.     Trachea: Phonation normal.  Cardiovascular:     Rate and Rhythm: Normal rate and regular rhythm.     Heart sounds: Normal heart sounds.  Pulmonary:     Effort: Pulmonary effort is normal.     Breath sounds: Normal breath sounds.  Abdominal:     Palpations: Abdomen is soft.     Tenderness: There is no abdominal tenderness.  Musculoskeletal: Normal range of motion.  Skin:    General: Skin is warm and dry.  Neurological:     Mental Status: He is alert and oriented to person, place, and time.     Cranial Nerves: No cranial nerve deficit.     Sensory: No sensory deficit.     Motor: No abnormal muscle tone.     Coordination: Coordination normal.  Psychiatric:        Mood and Affect: Mood normal.        Behavior: Behavior normal.        Thought Content: Thought content normal.        Judgment: Judgment normal.      ED Treatments / Results  Labs (all labs ordered are listed, but only abnormal results are displayed) Labs Reviewed  COMPREHENSIVE METABOLIC PANEL - Abnormal; Notable for the following components:      Result Value   CO2 21 (*)    Glucose, Bld 222 (*)    All other components within normal limits   URINALYSIS, ROUTINE W REFLEX MICROSCOPIC - Abnormal; Notable for the following components:   Glucose, UA 150 (*)    All other components within normal limits  CBC WITH DIFFERENTIAL/PLATELET  TROPONIN I    EKG EKG Interpretation  Date/Time:  Monday Aug 24 2018 13:59:26 EDT Ventricular Rate:  81 PR  Interval:    QRS Duration: 100 QT Interval:  361 QTC Calculation: 419 R Axis:   82 Text Interpretation:  Sinus rhythm Abnormal R-wave progression, early transition Baseline wander in lead(s) II III aVF V2 since last tracing no significant change Confirmed by Daleen Bo [phone]) on 08/24/2018 3:58:08 PM   Radiology Dg Chest Port 1 View  Result Date: 08/24/2018 CLINICAL DATA:  53 year old male with 3 days of diarrhea and nausea EXAM: PORTABLE CHEST 1 VIEW COMPARISON:  08/28/2011 FINDINGS: The heart size and mediastinal contours are within normal limits. Both lungs are clear. The visualized skeletal structures are unremarkable. IMPRESSION: Negative for acute cardiopulmonary disease Electronically Signed   By: Corrie Mckusick D.O.   On: 08/24/2018 14:32    Procedures Procedures (including critical care time)  Medications Ordered in ED Medications  sodium chloride 0.9 % bolus 1,000 mL (0 mLs Intravenous Stopped 08/24/18 1529)     Initial Impression / Assessment and Plan / ED Course  I have reviewed the triage vital signs and the nursing notes.  Pertinent labs & imaging results that were available during my care of the patient were reviewed by me and considered in my medical decision making (see chart for details).  Clinical Course as of Aug 24 1631  Mon Aug 24, 2018  1556 Normal  Urinalysis, Routine w reflex microscopic(!) [EW]  1556 Normal  CBC with Differential [EW]  1556 Normal  Troponin I - Once [EW]  1557 Normal except CO2 low, glucose high  Comprehensive metabolic panel(!) [EW]    Clinical Course User Index [EW] Daleen Bo, MD        Patient Vitals for the past 24  hrs:  BP Temp Temp src Pulse Resp SpO2 Height Weight  08/24/18 1600 126/83 - - 76 12 99 % - -  08/24/18 1530 136/85 - - 79 17 98 % - -  08/24/18 1500 130/85 - - 72 13 99 % - -  08/24/18 1430 128/82 - - 78 12 95 % - -  08/24/18 1400 (!) 142/90 97.7 F (36.5 C) Oral 74 14 95 % - -  08/24/18 1359 - - - - - - 5\' 11"  (1.803 m) 106.6 kg    4:31 PM Reevaluation with update and discussion. After initial assessment and treatment, an updated evaluation reveals he is comfortable now has had no more diarrhea.  Findings discussed with the patient and all questions were answered. Daleen Bo   Medical Decision Making: Nonspecific diarrhea, with achiness.  Screening evaluation is reassuring.  No evidence for metabolic instability, serious bacterial infection or impending vascular collapse.  CRITICAL CARE-no Performed by: Daleen Bo  Nursing Notes Reviewed/ Care Coordinated Applicable Imaging Reviewed Interpretation of Laboratory Data incorporated into ED treatment  The patient appears reasonably screened and/or stabilized for discharge and I doubt any other medical condition or other [facility] requiring further screening, evaluation, or treatment in the ED at this time prior to discharge.  Plan: Home Medications-continue routine medications and use Kaopectate if needed for diarrhea; Home Treatments-rest, fluids, gradually advance diet; return here if the recommended treatment, does not improve the symptoms; Recommended follow up-PCP, PRN    Final Clinical Impressions(s) / ED Diagnoses   Final diagnoses:  Diarrhea, unspecified type    ED Discharge Orders    None       Daleen Bo, MD 08/24/18 [phone]

## 2018-08-24 NOTE — Discharge Instructions (Addendum)
There were no serious causes found to explain the diarrhea.  If you continue to have diarrhea, use Kaopectate.  Also stay on a low fiber diet for now.  After couple days you can start eating more regular food.

## 2018-08-24 NOTE — ED Triage Notes (Signed)
Pt reports 3 days of diarrhea and nausea. Pain upper abdomen which is not bothering him now. He woke up approx 3 am with his heart racing and has had more episodes today/ PCP recommended him coming to ED

## 2018-08-24 NOTE — ED Notes (Signed)
Pt e signed, but signature pad is not working. Verbalized understanding

## 2018-08-24 NOTE — Progress Notes (Signed)
Virtual Visit via telephone Note Due to COVID-19, visit is conducted virtually and was requested by patient. This visit type was conducted due to national recommendations for restrictions regarding the COVID-19 Pandemic (e.g. social distancing) in an effort to limit this patient's exposure and mitigate transmission in our community. All issues noted in this document were discussed and addressed.  A physical exam was not performed with this format.   I connected with [name] on 08/24/18 at 1310 by telephone and verified that I am speaking with the correct person using two identifiers. [name] is currently located at home and family is currently with them during visit. The provider, Monia Pouch, FNP is located in their office at time of visit.  I discussed the limitations, risks, security and privacy concerns of performing an evaluation and management service by telephone and the availability of in person appointments. I also discussed with the patient that there may be a patient responsible charge related to this service. The patient expressed understanding and agreed to proceed.  Subjective:  [name], male    DOB: 1965-12-18, 53 y.o.   MRN: [id number]  Chief Complaint:  Dehydration; Palpitations; Spasms; and Diarrhea   HPI: Adrian Jacobson is a 53 y.o. male presenting on 08/24/2018 for Dehydration; Palpitations; Spasms; and Diarrhea   Pt reports diarrhea that started 3-4 days ago. States the diarrhea has been constant, 3-4 times per day. States he is having significant muscle cramps in his arms, legs, hands, and feet. States last night he awoke from his sleep around 0300 with palpitations. No chest pain or shortness of breath with the palpitations. No fever, chills, fatigue, confusion, or decreased urination. Has been drinking 12 oz of water per day.   Palpitations   This is a new problem. The current episode started yesterday. The problem occurs  intermittently. The problem has been resolved. The symptoms are aggravated by unknown. Pertinent negatives include no anxiety, chest fullness, chest pain, coughing, diaphoresis, dizziness, fever, irregular heartbeat, malaise/fatigue, nausea, near-syncope, numbness, shortness of breath, syncope, vomiting or weakness. He has tried nothing for the symptoms. Risk factors include being male, diabetes mellitus and obesity.  Diarrhea   This is a new problem. The current episode started in the past 7 days. The problem occurs 2 to 4 times per day. The problem has been unchanged. The stool consistency is described as watery. The patient states that diarrhea does not awaken him from sleep. Associated symptoms include abdominal pain and myalgias. Pertinent negatives include no arthralgias, bloating, chills, coughing, fever, headaches, increased  flatus, sweats, URI, vomiting or weight loss. There are no known risk factors. He has tried nothing for the symptoms.     Relevant past medical, surgical, family, and social history reviewed and updated as indicated.  Allergies and medications reviewed and updated.   Past Medical History:  Diagnosis Date  . Allergy   . Anxiety   . Asthma   . Chronic kidney disease    stones  . Chronic radicular cervical pain 08/04/2018  . Depression   . Diabetes mellitus without complication ([place])   . Essential hypertension 01/02/2017  . GERD (gastroesophageal reflux disease)   . HLD (hyperlipidemia) 01/02/2017  . Sleep apnea    no CPAP    Past Surgical History:  Procedure Laterality Date  . CARDIAC CATHETERIZATION N/A 11/29/2015   Procedure: Left Heart Cath and Coronary Angiography;  Surgeon: Belva Crome, MD;  Location: [facility];  Service:  Cardiovascular;  Laterality: N/A;  . STONE EXTRACTION WITH BASKET    . TESTICLE SURGERY      Social History   Socioeconomic History  . Marital status: Married    Spouse name: tracy  . Number of children: 2  . Years of  education: 72  . Highest education level: Not on file  Occupational History  . Occupation: supervisor-maintenance  Social Needs  . Financial resource strain: Not on file  . Food insecurity:    Worry: Not on file    Inability: Not on file  . Transportation needs:    Medical: Not on file    Non-medical: Not on file  Tobacco Use  . Smoking status: Never Smoker  . Smokeless tobacco: Never Used  Substance and Sexual Activity  . Alcohol use: No  . Drug use: No  . Sexual activity: Yes    Birth control/protection: Surgical  Lifestyle  . Physical activity:    Days per week: Not on file    Minutes per session: Not on file  . Stress: Not on file  Relationships  . Social connections:    Talks on phone: Not on file    Gets together: Not on file    Attends religious service: Not on file    Active member of club or organization: Not on file    Attends meetings of clubs or organizations: Not on file    Relationship status: Not on file  . Intimate partner violence:    Fear of current or ex partner: Not on file    Emotionally abused: Not on file    Physically abused: Not on file    Forced sexual activity: Not on file  Other Topics Concern  . 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   3 sons one daughter, married ( 2 children and two step)   No caffeine no alcohol no tobacco no drugs   Likes computers, farming, outdoors    Outpatient Encounter Medications as of 08/24/2018  Medication Sig  . atorvastatin (LIPITOR) 40 MG tablet Take 1 tablet (40 mg total) by mouth daily.  . baclofen (LIORESAL) 10 MG tablet Take 1 tablet (10 mg total) by mouth 3 (three) times daily.  Marland Kitchen dicyclomine (BENTYL) 20 MG tablet TAKE 1 TABLET BY MOUTH EVERY 6 HOURS AS NEEDED FOR SPASMS (ABDOMINAL PAIN).  Marland Kitchen glipiZIDE (GLUCOTROL XL) 10 MG 24 hr tablet Take 1 tablet (10 mg total) by mouth 2 (two) times daily.  Marland Kitchen glucose blood test strip Use as instructed   . Lancets MISC Use as directed to check blood sugar twice daily  . lisinopril (PRINIVIL,ZESTRIL) 5 MG tablet Take 1 tablet (5 mg total) by mouth daily.  . meloxicam (MOBIC) 15 MG tablet TAKE 1 TABLET BY MOUTH EVERY DAY  . metFORMIN (GLUCOPHAGE-XR) 500 MG 24 hr tablet Take 2 tablets (1,000 mg total) by mouth 2 (two) times daily.   No facility-administered encounter medications on file as of 08/24/2018.     Allergies  Allergen Reactions  . Strawberry Flavor Anaphylaxis    Anything with the fruit  . Hydrocodone Other (See Comments)    Mood swings   . Niacin And Related Other (See Comments)    Feel like skin burning   . Omeprazole Other (See Comments)    Abdominal pain    Review of Systems  Constitutional: Negative for chills, diaphoresis, fatigue, fever, malaise/fatigue, unexpected weight change and weight loss.  Respiratory: Negative for cough and shortness of breath.   Cardiovascular: Positive for palpitations. Negative for chest pain, leg swelling, syncope and near-syncope.  Gastrointestinal: Positive for abdominal pain and diarrhea. Negative for bloating, flatus, nausea and vomiting.  Genitourinary: Negative for decreased urine volume and difficulty urinating.  Musculoskeletal: Positive for myalgias. Negative for arthralgias, back pain, gait problem, joint swelling, neck pain and neck stiffness.  Neurological: Negative for dizziness, tremors, seizures, syncope, facial asymmetry, speech difficulty, weakness, light-headedness, numbness and headaches.  Psychiatric/Behavioral: Negative for confusion. The patient is not nervous/anxious.   All other systems reviewed and are negative.        Observations/Objective: No vital signs or physical exam, this was a telephone or virtual health encounter.  Pt alert and oriented, answers all questions appropriately, and able to speak in full sentences.    Assessment and Plan: Diagnoses and all orders for this visit:  Muscle cramps  Diarrhea, unspecified type Palpitations Symptoms concerning for dehydration and electrolyte imbalance. Due to reported symptoms pt advised to go to the ED for evaluation and treatment. Pt agrees to this plan.     Follow Up Instructions: Return if symptoms worsen or fail to improve.    I discussed the assessment and treatment plan with the patient. The patient was provided an opportunity to ask questions and all were answered. The patient agreed with the plan and demonstrated an understanding of the instructions.   The patient was advised to call back or seek an in-person evaluation if the symptoms worsen or if the condition fails to improve as anticipated.  The above assessment and management plan was discussed with the patient. The patient verbalized understanding of and has agreed to the management plan. Patient is aware to call the clinic if symptoms persist or worsen. Patient is aware when to return to the clinic for a follow-up visit. Patient educated on when it is appropriate to go to the emergency department.    I provided 15 minutes of non-face-to-face time during this encounter. The call started at 1310. The call ended at 1325. The other time was used for coordination of care.    Monia Pouch, FNP-C [facility] [address] [place], [place] [address] [phone]

## 2018-08-24 NOTE — Patient Instructions (Signed)
To ED for further evaluation and treatment.

## 2018-08-26 ENCOUNTER — Ambulatory Visit: Payer: Managed Care, Other (non HMO) | Attending: Family Medicine | Admitting: Physical Therapy

## 2018-08-26 ENCOUNTER — Encounter: Payer: Self-pay | Admitting: Physical Therapy

## 2018-08-26 ENCOUNTER — Other Ambulatory Visit: Payer: Self-pay

## 2018-08-26 DIAGNOSIS — R293 Abnormal posture: Secondary | ICD-10-CM | POA: Diagnosis present

## 2018-08-26 DIAGNOSIS — M542 Cervicalgia: Secondary | ICD-10-CM

## 2018-08-26 NOTE — Therapy (Signed)
[facility] [place], [place], [address] Phone: [phone]   Fax:  [phone]  Physical Therapy Evaluation  Patient Details  Name: Adrian Jacobson MRN: [id number] Date of Birth: [birth year] Referring Provider (PT): Caryl Pina MD   Encounter Date: 08/26/2018  PT End of Session - 08/26/18 1120    Visit Number  1    Number of Visits  12    Date for PT Re-Evaluation  10/07/18    Authorization Type  FOTO AT LEAST EVERY 5TH VISIT.  PROGRESS NOTE AT 10TH VISIT.    PT Start Time  [phone]    PT Stop Time  1047    PT Time Calculation (min)  52 min    Activity Tolerance  Patient tolerated treatment well    Behavior During Therapy  WFL for tasks assessed/performed       Past Medical History:  Diagnosis Date  . Allergy   . Anxiety   . Asthma   . Chronic kidney disease    stones  . Chronic radicular cervical pain 08/04/2018  . Depression   . Diabetes mellitus without complication ([place])   . Essential hypertension 01/02/2017  . GERD (gastroesophageal reflux disease)   . HLD (hyperlipidemia) 01/02/2017  . Sleep apnea    no CPAP    Past Surgical History:  Procedure Laterality Date  . CARDIAC CATHETERIZATION N/A 11/29/2015   Procedure: Left Heart Cath and Coronary Angiography;  Surgeon: Belva Crome, MD;  Location: [facility];  Service: Cardiovascular;  Laterality: N/A;  . STONE EXTRACTION WITH BASKET    . TESTICLE SURGERY      There were no vitals filed for this visit.   Subjective Assessment - 08/26/18 1031    Subjective  COVID-19 screen performed prior to patient entering clinic.  The patient presents to the clinic today with c/o ongoing right cervical and right UE pain over the last several weeks.  He states he woke up one morning with the aforementioned symptoms.  His pain at that time was ~8/10.  At rest his pain is lower though certain movement reproduce the symptoms.      Pertinent History  DM.    Diagnostic tests  X-ray.    Patient Stated Goals  Get rid of pain.    Currently in Pain?  Yes    Pain Score  4     Pain Location  Neck    Pain Descriptors / Indicators  Aching    Pain Type  Acute pain    Pain Radiating Towards  Right UE.    Pain Onset  1 to 4 weeks ago    Aggravating Factors   Movement. Being at computer.    Pain Relieving Factors  Rest.         [facility] PT Assessment - 08/26/18 0001      Assessment   Medical Diagnosis  Cervical radiculopathy.    Referring Provider (PT)  Caryl Pina MD    Onset Date/Surgical Date  --   April 2020.     Precautions   Precautions  None      Restrictions   Weight Bearing Restrictions  No      Balance Screen   Has the patient fallen in the past 6 months  No    Has the patient had a decrease in activity level because of a fear of falling?   No    Is the patient reluctant to leave their home because of a fear  of falling?   No      Home Environment   Living Environment  Private residence      Prior Function   Level of Independence  Independent      Observation/Other Assessments   Focus on Therapeutic Outcomes (FOTO)   47% limitation.      Posture/Postural Control   Posture/Postural Control  Postural limitations    Postural Limitations  Rounded Shoulders;Forward head      ROM / Strength   AROM / PROM / Strength  AROM;Strength      AROM   Overall AROM Comments  Right active cervical rotation= 55 degrees, left= 65 degrees and bilateral sidebending= 15 degrees.      Strength   Overall Strength Comments  Normal UE strength.      Palpation   Palpation comment  Tender right of ~T3/4 near right medial scapular border.      Special Tests   Other special tests  UE DTR's= 1+ to 2+/4+.      Ambulation/Gait   Gait Comments  WNL.                Objective measurements completed on examination: See above findings.      [facility] Adult PT Treatment/Exercise - 08/26/18 0001      Modalities   Modalities  Traction       Traction   Type of Traction  Cervical    Min (lbs)  5    Max (lbs)  16    Hold Time  99    Rest Time  5    Time  --   15.            PT Education - 08/26/18 1204    Education Details  Chin tucka nd cervical extension.    Person(s) Educated  Patient    Methods  Explanation;Demonstration;Handout    Comprehension  Verbalized understanding;Returned demonstration          PT Long Term Goals - 08/26/18 1206      PT LONG TERM GOAL #1   Title  Independent with an HEP.    Time  6    Period  Weeks    Status  New      PT LONG TERM GOAL #2   Title  Increase active cervical rotation to 70 degrees+ so patient can turn head more easily while driving.    Time  6    Period  Weeks    Status  New      PT LONG TERM GOAL #3   Title  Eliminate UE symptoms.    Time  6    Period  Weeks    Status  New             Plan - 08/26/18 1159    Clinical Impression Statement  The patient presents to O[facility] with c/o right cervical pain with also travels over his right Tricep to wrist.  He is tender to palpation right of T3/4 at medial scapular border.  He has a forward head posture.  He lacks cervical range of motion.  Patient will benefit from skilled physical therapy intervention to address deficits and pain.         Personal Factors and Comorbidities  Comorbidity 1    Comorbidities  DM    Stability/Clinical Decision Making  Evolving/Moderate complexity    Clinical Decision Making  Low    Rehab Potential  Excellent    PT Frequency  2x / week  PT Duration  6 weeks    PT Treatment/Interventions  ADLs/Self Care Home Management;Cryotherapy;Electrical Stimulation;Ultrasound;Traction;Moist Heat;Therapeutic activities;Therapeutic exercise;Patient/family education;Manual techniques;Dry needling;Spinal Manipulations    PT Next Visit Plan  Int traction at 18# (max 23-25#); combo e'stim/U/S and STW/M to right C-spine/medial scpaular border.  Cervical retraction and extension.     Consulted and Agree with Plan of Care  Patient       Patient will benefit from skilled therapeutic intervention in order to improve the following deficits and impairments:  Pain, Decreased activity tolerance, Decreased range of motion, Postural dysfunction  Visit Diagnosis: Cervicalgia - Plan: PT plan of care cert/re-cert  Abnormal posture - Plan: PT plan of care cert/re-cert     Problem List Patient Active Problem List   Diagnosis Date Noted  . Chronic radicular cervical pain 08/04/2018  . Cervical pain 08/04/2018  . Type 2 diabetes mellitus ([place]) 01/02/2017  . Essential hypertension 01/02/2017  . HLD (hyperlipidemia) 01/02/2017  . Chronic GERD 01/02/2017  . Kidney stones 01/02/2017  . OSA (obstructive sleep apnea) 01/02/2017    , [place] MPT 08/26/2018, 12:11 PM  [facility] [place], [place], [address] Phone: [phone]   Fax:  [phone]  Name: DYLLON HENKEN MRN: [id number] Date of Birth: January 13, 1966

## 2018-08-28 ENCOUNTER — Other Ambulatory Visit: Payer: Self-pay

## 2018-08-28 ENCOUNTER — Ambulatory Visit: Payer: Managed Care, Other (non HMO) | Admitting: Physical Therapy

## 2018-08-28 DIAGNOSIS — M542 Cervicalgia: Secondary | ICD-10-CM | POA: Diagnosis not present

## 2018-08-28 DIAGNOSIS — R293 Abnormal posture: Secondary | ICD-10-CM

## 2018-08-28 NOTE — Therapy (Signed)
[facility] [place], [place], [address] Phone: [phone]   Fax:  [phone]  Physical Therapy Treatment  Patient Details  Name: Adrian Jacobson MRN: [id number] Date of Birth: [birth year] Referring Provider (PT): Caryl Pina MD   Encounter Date: 08/28/2018  PT End of Session - 08/28/18 1112    Visit Number  2    Number of Visits  12    Date for PT Re-Evaluation  10/07/18    Authorization Type  FOTO AT LEAST EVERY 5TH VISIT.  PROGRESS NOTE AT 10TH VISIT.    PT Start Time  [phone]    PT Stop Time  1033    PT Time Calculation (min)  47 min    Activity Tolerance  Patient tolerated treatment well    Behavior During Therapy  WFL for tasks assessed/performed       Past Medical History:  Diagnosis Date  . Allergy   . Anxiety   . Asthma   . Chronic kidney disease    stones  . Chronic radicular cervical pain 08/04/2018  . Depression   . Diabetes mellitus without complication ([place])   . Essential hypertension 01/02/2017  . GERD (gastroesophageal reflux disease)   . HLD (hyperlipidemia) 01/02/2017  . Sleep apnea    no CPAP    Past Surgical History:  Procedure Laterality Date  . CARDIAC CATHETERIZATION N/A 11/29/2015   Procedure: Left Heart Cath and Coronary Angiography;  Surgeon: Belva Crome, MD;  Location: [facility];  Service: Cardiovascular;  Laterality: N/A;  . STONE EXTRACTION WITH BASKET    . TESTICLE SURGERY      There were no vitals filed for this visit.  Subjective Assessment - 08/28/18 1038    Subjective  COVID-19 screen performed prior to patient entering clinic. I did well after that treatment.    Patient Stated Goals  Get rid of pain.    Currently in Pain?  Yes    Pain Score  4     Pain Location  Neck    Pain Orientation  Left                       [facility] Adult PT Treatment/Exercise - 08/28/18 0001      Modalities   Modalities  Traction;Ultrasound      Ultrasound   Ultrasound Location  Right lower cervical and medial scapular border area.    Ultrasound Parameters  Combo e'stim/U/S at 1.50 W/CM2 x 12.      Traction   Type of Traction  Cervical    Min (lbs)  5    Max (lbs)  20    Hold Time  99    Rest Time  5    Time  15      Manual Therapy   Manual Therapy  Soft tissue mobilization    Soft tissue mobilization  IASTM x 12 minutes to right cervical region and medial scapular border area.                  PT Long Term Goals - 08/26/18 1206      PT LONG TERM GOAL #1   Title  Independent with an HEP.    Time  6    Period  Weeks    Status  New      PT LONG TERM GOAL #2   Title  Increase active cervical rotation to 70 degrees+ so patient can turn head more easily while  driving.    Time  6    Period  Weeks    Status  New      PT LONG TERM GOAL #3   Title  Eliminate UE symptoms.    Time  6    Period  Weeks    Status  New            Plan - 08/28/18 1118    Clinical Impression Statement  Patient did great with treatment today.  He tolerated a 4# increase in intermittment cervical traction without complaint.    Stability/Clinical Decision Making  Evolving/Moderate complexity    PT Treatment/Interventions  ADLs/Self Care Home Management;Cryotherapy;Electrical Stimulation;Ultrasound;Traction;Moist Heat;Therapeutic activities;Therapeutic exercise;Patient/family education;Manual techniques;Dry needling;Spinal Manipulations    PT Next Visit Plan  Int traction at 18# (max 23-25#); combo e'stim/U/S and STW/M to right C-spine/medial scpaular border.  Cervical retraction and extension.    Consulted and Agree with Plan of Care  Patient       Patient will benefit from skilled therapeutic intervention in order to improve the following deficits and impairments:  Pain, Decreased activity tolerance, Decreased range of motion, Postural dysfunction  Visit Diagnosis: Cervicalgia  Abnormal posture     Problem List Patient Active  Problem List   Diagnosis Date Noted  . Chronic radicular cervical pain 08/04/2018  . Cervical pain 08/04/2018  . Type 2 diabetes mellitus ([place]) 01/02/2017  . Essential hypertension 01/02/2017  . HLD (hyperlipidemia) 01/02/2017  . Chronic GERD 01/02/2017  . Kidney stones 01/02/2017  . OSA (obstructive sleep apnea) 01/02/2017    Joene Gelder, [place] MPT 08/28/2018, 11:21 AM  [facility] [place], [place], [address] Phone: [phone]   Fax:  [phone]  Name: Adrian Jacobson MRN: [id number] Date of Birth: 05-12-1965

## 2018-09-02 ENCOUNTER — Ambulatory Visit: Payer: Managed Care, Other (non HMO) | Admitting: Physical Therapy

## 2018-09-02 ENCOUNTER — Other Ambulatory Visit: Payer: Self-pay

## 2018-09-02 ENCOUNTER — Encounter: Payer: Self-pay | Admitting: Physical Therapy

## 2018-09-02 DIAGNOSIS — R293 Abnormal posture: Secondary | ICD-10-CM

## 2018-09-02 DIAGNOSIS — M542 Cervicalgia: Secondary | ICD-10-CM

## 2018-09-02 NOTE — Therapy (Signed)
[facility] [place], [place], [address] Phone: [phone]   Fax:  [phone]  Physical Therapy Treatment  Patient Details  Name: Adrian Jacobson MRN: [id number] Date of Birth: January 19, 1966 Referring Provider (PT): Caryl Pina MD   Encounter Date: 09/02/2018  PT End of Session - 09/02/18 1206    Visit Number  3    Number of Visits  12    Date for PT Re-Evaluation  10/07/18    Authorization Type  FOTO AT LEAST EVERY 5TH VISIT.  PROGRESS NOTE AT 10TH VISIT.    PT Start Time  [phone]    PT Stop Time  1035    PT Time Calculation (min)  48 min    Activity Tolerance  Patient tolerated treatment well    Behavior During Therapy  WFL for tasks assessed/performed       Past Medical History:  Diagnosis Date  . Allergy   . Anxiety   . Asthma   . Chronic kidney disease    stones  . Chronic radicular cervical pain 08/04/2018  . Depression   . Diabetes mellitus without complication ([place])   . Essential hypertension 01/02/2017  . GERD (gastroesophageal reflux disease)   . HLD (hyperlipidemia) 01/02/2017  . Sleep apnea    no CPAP    Past Surgical History:  Procedure Laterality Date  . CARDIAC CATHETERIZATION N/A 11/29/2015   Procedure: Left Heart Cath and Coronary Angiography;  Surgeon: Belva Crome, MD;  Location: [facility];  Service: Cardiovascular;  Laterality: N/A;  . STONE EXTRACTION WITH BASKET    . TESTICLE SURGERY      There were no vitals filed for this visit.  Subjective Assessment - 09/02/18 1201    Subjective  COVID-19 screen performed prior to patient entering clinic.  Treatments are helping.  The pain is leaving my right arm.    Diagnostic tests  X-ray.    Patient Stated Goals  Get rid of pain.    Currently in Pain?  Yes    Pain Score  3     Pain Location  Neck    Pain Orientation  Left    Pain Descriptors / Indicators  Aching    Pain Onset  1 to 4 weeks ago                        [facility] Adult PT Treatment/Exercise - 09/02/18 0001      Ultrasound   Ultrasound Location  RTR lower cervical/medial scapular border.    Ultrasound Parameters  Combo e'stim/U/S at 1.50 W/CM2 x 12 minutes.      Traction   Type of Traction  Cervical    Min (lbs)  5    Max (lbs)  23    Hold Time  99    Rest Time  5    Time  15      Manual Therapy   Manual Therapy  Soft tissue mobilization    Soft tissue mobilization  IASTM x 12 minutes to patient right lower cervical/UT and medial scapular border.                  PT Long Term Goals - 08/26/18 1206      PT LONG TERM GOAL #1   Title  Independent with an HEP.    Time  6    Period  Weeks    Status  New      PT LONG TERM  GOAL #2   Title  Increase active cervical rotation to 70 degrees+ so patient can turn head more easily while driving.    Time  6    Period  Weeks    Status  New      PT LONG TERM GOAL #3   Title  Eliminate UE symptoms.    Time  6    Period  Weeks    Status  New            Plan - 09/02/18 1204    Clinical Impression Statement  Excellent response to treatments with patient reporting a decrease in his right UE pain/symptoms.    Stability/Clinical Decision Making  Evolving/Moderate complexity    PT Treatment/Interventions  ADLs/Self Care Home Management;Cryotherapy;Electrical Stimulation;Ultrasound;Traction;Moist Heat;Therapeutic activities;Therapeutic exercise;Patient/family education;Manual techniques;Dry needling;Spinal Manipulations    PT Next Visit Plan  Int traction at 18# (max 23-25#); combo e'stim/U/S and STW/M to right C-spine/medial scpaular border.  Cervical retraction and extension.    Consulted and Agree with Plan of Care  Patient       Patient will benefit from skilled therapeutic intervention in order to improve the following deficits and impairments:  Pain, Decreased activity tolerance, Decreased range of motion, Postural dysfunction  Visit  Diagnosis: Cervicalgia  Abnormal posture     Problem List Patient Active Problem List   Diagnosis Date Noted  . Chronic radicular cervical pain 08/04/2018  . Cervical pain 08/04/2018  . Type 2 diabetes mellitus ([place]) 01/02/2017  . Essential hypertension 01/02/2017  . HLD (hyperlipidemia) 01/02/2017  . Chronic GERD 01/02/2017  . Kidney stones 01/02/2017  . OSA (obstructive sleep apnea) 01/02/2017    Atharva Mirsky, [place] MPT 09/02/2018, 12:08 PM  [facility] [place], [place], [address] Phone: [phone]   Fax:  [phone]  Name: Adrian Jacobson MRN: [id number] Date of Birth: 07-Jun-1965

## 2018-09-04 ENCOUNTER — Encounter: Payer: Self-pay | Admitting: Physical Therapy

## 2018-09-04 ENCOUNTER — Ambulatory Visit: Payer: Managed Care, Other (non HMO) | Admitting: Physical Therapy

## 2018-09-04 ENCOUNTER — Other Ambulatory Visit: Payer: Self-pay

## 2018-09-04 DIAGNOSIS — M542 Cervicalgia: Secondary | ICD-10-CM | POA: Diagnosis not present

## 2018-09-04 DIAGNOSIS — R293 Abnormal posture: Secondary | ICD-10-CM

## 2018-09-04 NOTE — Therapy (Signed)
[facility] [place], [place], [address] Phone: [phone]   Fax:  [phone]  Physical Therapy Treatment  Patient Details  Name: Adrian Jacobson MRN: [id number] Date of Birth: 1965-07-18 Referring Provider (PT): Caryl Pina MD   Encounter Date: 09/04/2018  PT End of Session - 09/04/18 1406    Visit Number  4    Number of Visits  12    Date for PT Re-Evaluation  10/07/18    Authorization Type  FOTO AT LEAST EVERY 5TH VISIT.  PROGRESS NOTE AT 10TH VISIT.    PT Start Time  [phone]    PT Stop Time  1041    PT Time Calculation (min)  51 min    Activity Tolerance  Patient tolerated treatment well    Behavior During Therapy  WFL for tasks assessed/performed       Past Medical History:  Diagnosis Date  . Allergy   . Anxiety   . Asthma   . Chronic kidney disease    stones  . Chronic radicular cervical pain 08/04/2018  . Depression   . Diabetes mellitus without complication ([place])   . Essential hypertension 01/02/2017  . GERD (gastroesophageal reflux disease)   . HLD (hyperlipidemia) 01/02/2017  . Sleep apnea    no CPAP    Past Surgical History:  Procedure Laterality Date  . CARDIAC CATHETERIZATION N/A 11/29/2015   Procedure: Left Heart Cath and Coronary Angiography;  Surgeon: Belva Crome, MD;  Location: [facility];  Service: Cardiovascular;  Laterality: N/A;  . STONE EXTRACTION WITH BASKET    . TESTICLE SURGERY      There were no vitals filed for this visit.  Subjective Assessment - 09/04/18 1402    Subjective  COVID-19 screen performed prior to patient entering clinic.  Patient states treatments have helped a lot.    Patient Stated Goals  Get rid of pain.    Currently in Pain?  Yes    Pain Score  2     Pain Location  Neck    Pain Orientation  Left    Pain Descriptors / Indicators  Aching    Pain Type  Acute pain    Pain Onset  1 to 4 weeks ago                       [facility] Adult  PT Treatment/Exercise - 09/04/18 0001      Modalities   Modalities  Traction;Ultrasound      Ultrasound   Ultrasound Location  RT lower cervical/medial scapular border.    Ultrasound Parameters  ombo e'stim/U/S at 1.50 W/CM2 x 12 minutes.      Traction   Type of Traction  Cervical    Min (lbs)  5    Max (lbs)  25    Hold Time  99    Rest Time  5    Time  15      Manual Therapy   Manual Therapy  Soft tissue mobilization    Soft tissue mobilization  IASTM x 12 minutes.                  PT Long Term Goals - 08/26/18 1206      PT LONG TERM GOAL #1   Title  Independent with an HEP.    Time  6    Period  Weeks    Status  New      PT LONG TERM GOAL #  2   Title  Increase active cervical rotation to 70 degrees+ so patient can turn head more easily while driving.    Time  6    Period  Weeks    Status  New      PT LONG TERM GOAL #3   Title  Eliminate UE symptoms.    Time  6    Period  Weeks    Status  New            Plan - 09/04/18 1410    Clinical Impression Statement  Patient doing great with treatments and reporting a significant reduction in his right UE sympoms.    Personal Factors and Comorbidities  Comorbidity 1    Comorbidities  DM    Stability/Clinical Decision Making  Evolving/Moderate complexity    Rehab Potential  Excellent    PT Frequency  2x / week    PT Duration  6 weeks    PT Treatment/Interventions  ADLs/Self Care Home Management;Cryotherapy;Electrical Stimulation;Ultrasound;Traction;Moist Heat;Therapeutic activities;Therapeutic exercise;Patient/family education;Manual techniques;Dry needling;Spinal Manipulations    PT Next Visit Plan  Int traction at 18# (max 23-25#); combo e'stim/U/S and STW/M to right C-spine/medial scpaular border.  Cervical retraction and extension.    Consulted and Agree with Plan of Care  Patient       Patient will benefit from skilled therapeutic intervention in order to improve the following deficits and  impairments:  Pain, Decreased activity tolerance, Decreased range of motion, Postural dysfunction  Visit Diagnosis: Cervicalgia  Abnormal posture     Problem List Patient Active Problem List   Diagnosis Date Noted  . Chronic radicular cervical pain 08/04/2018  . Cervical pain 08/04/2018  . Type 2 diabetes mellitus ([place]) 01/02/2017  . Essential hypertension 01/02/2017  . HLD (hyperlipidemia) 01/02/2017  . Chronic GERD 01/02/2017  . Kidney stones 01/02/2017  . OSA (obstructive sleep apnea) 01/02/2017    Khyli Swaim, [place] MPT 09/04/2018, 2:12 PM  [facility] [place], [place], [address] Phone: [phone]   Fax:  [phone]  Name: Adrian Jacobson MRN: [id number] Date of Birth: 02/03/1966

## 2018-09-09 ENCOUNTER — Ambulatory Visit: Payer: Managed Care, Other (non HMO) | Admitting: Physical Therapy

## 2018-09-11 ENCOUNTER — Ambulatory Visit: Payer: Managed Care, Other (non HMO) | Admitting: Physical Therapy

## 2018-09-11 ENCOUNTER — Other Ambulatory Visit: Payer: Self-pay

## 2018-09-11 ENCOUNTER — Encounter: Payer: Self-pay | Admitting: Physical Therapy

## 2018-09-11 DIAGNOSIS — M542 Cervicalgia: Secondary | ICD-10-CM

## 2018-09-11 DIAGNOSIS — R293 Abnormal posture: Secondary | ICD-10-CM

## 2018-09-11 NOTE — Therapy (Addendum)
[facility] [place], [place], [address] Phone: [phone]   Fax:  [phone]  Physical Therapy Treatment  Patient Details  Name: Adrian Jacobson MRN: [id number] Date of Birth: [birth year] Referring Provider (PT): Caryl Pina MD   Encounter Date: 09/11/2018  PT End of Session - 09/11/18 0956     Visit Number  5    Number of Visits  12    Date for PT Re-Evaluation  10/07/18    Authorization Type  FOTO AT LEAST EVERY 5TH VISIT.  PROGRESS NOTE AT 10TH VISIT.    PT Start Time  [phone]    PT Stop Time  1039    PT Time Calculation (min)  43 min    Activity Tolerance  Patient tolerated treatment well    Behavior During Therapy  [facility] for tasks assessed/performed        Past Medical History:  Diagnosis Date   Allergy    Anxiety    Asthma    Chronic kidney disease    stones   Chronic radicular cervical pain 08/04/2018   Depression    Diabetes mellitus without complication ([place])    Essential hypertension 01/02/2017   GERD (gastroesophageal reflux disease)    HLD (hyperlipidemia) 01/02/2017   Sleep apnea    no CPAP    Past Surgical History:  Procedure Laterality Date   CARDIAC CATHETERIZATION N/A 11/29/2015   Procedure: Left Heart Cath and Coronary Angiography;  Surgeon: Belva Crome, MD;  Location: [facility];  Service: Cardiovascular;  Laterality: N/A;   STONE EXTRACTION WITH BASKET     TESTICLE SURGERY      There were no vitals filed for this visit.  Subjective Assessment - 09/11/18 0956     Subjective  COVID-19 screen performed prior to patient entering clinic.  Patient states some stiffness but not really pain.    Pertinent History  DM.    Diagnostic tests  X-ray.    Patient Stated Goals  Get rid of pain.    Currently in Pain?  No/denies          [facility] PT Assessment - 09/11/18 0001       Assessment   Medical Diagnosis  Cervical radiculopathy.    Referring Provider (PT)  Vonna Kotyk Dettinger MD       Precautions   Precautions  None      Restrictions   Weight Bearing Restrictions  No                    [facility] Adult PT Treatment/Exercise - 09/11/18 0001       Modalities   Modalities  Ultrasound;Traction      Ultrasound   Ultrasound Location  R UT/cervical paraspinals    Ultrasound Parameters  1.5 w/cm2, 100%, 1 mhz x10 min    Ultrasound Goals  Pain      Traction   Type of Traction  Cervical    Min (lbs)  5    Max (lbs)  25    Hold Time  99    Rest Time  5    Time  15      Manual Therapy   Manual Therapy  Soft tissue mobilization    Soft tissue mobilization  STW/MFR to R cervical paraspinals, UT, mid trap in sitting to reduce muscle tightness and stiffness of cervical spine                   PT Long Term  Goals - 09/11/18 1048       PT LONG TERM GOAL #1   Title  Independent with an HEP.    Time  6    Period  Weeks    Status  On-going      PT LONG TERM GOAL #2   Title  Increase active cervical rotation to 70 degrees+ so patient can turn head more easily while driving.    Time  6    Period  Weeks    Status  On-going      PT LONG TERM GOAL #3   Title  Eliminate UE symptoms.    Time  6    Period  Weeks    Status  Partially Met             Plan - 09/11/18 1044     Clinical Impression Statement  Patient presented in clinic with only complaints of cervical stiffness but no pain. Patient reports initially following traction he has RUE tingling to medial elbow region but as long as he does not complete any strenuous activities than the symptoms dissipate until next PT treatment. Patient also dealing with a lot of stress currently related to his father's health. Increased muscle tightness palpable of R UT/mid trap and proximal cervical paraspinals. Normal modalities response noted following removal of the modalities. Cervical mechanical traction completed at 25# max again with no complaints following end of session.    Personal  Factors and Comorbidities  Comorbidity 1    Comorbidities  DM    Stability/Clinical Decision Making  Evolving/Moderate complexity    Rehab Potential  Excellent    PT Frequency  2x / week    PT Duration  6 weeks    PT Treatment/Interventions  ADLs/Self Care Home Management;Cryotherapy;Electrical Stimulation;Ultrasound;Traction;Moist Heat;Therapeutic activities;Therapeutic exercise;Patient/family education;Manual techniques;Dry needling;Spinal Manipulations    PT Next Visit Plan  Int traction at 18# (max 23-25#); combo e'stim/U/S and STW/M to right C-spine/medial scpaular border.  Cervical retraction and extension.    Consulted and Agree with Plan of Care  Patient        Patient will benefit from skilled therapeutic intervention in order to improve the following deficits and impairments:  Pain, Decreased activity tolerance, Decreased range of motion, Postural dysfunction  Visit Diagnosis: Cervicalgia  Abnormal posture     Problem List Patient Active Problem List   Diagnosis Date Noted   Chronic radicular cervical pain 08/04/2018   Cervical pain 08/04/2018   Type 2 diabetes mellitus ([place]) 01/02/2017   Essential hypertension 01/02/2017   HLD (hyperlipidemia) 01/02/2017   Chronic GERD 01/02/2017   Kidney stones 01/02/2017   OSA (obstructive sleep apnea) 01/02/2017    Standley Brooking, PTA 09/11/2018, 10:48 AM  [facility] [place], [place], [address] Phone: [phone]   Fax:  [phone]  Name: Adrian Jacobson MRN: [id number] Date of Birth: 1965/11/09  PHYSICAL THERAPY DISCHARGE SUMMARY  Visits from Start of Care: 5.  Current functional level related to goals / functional outcomes: See above.   Remaining deficits: See below.   Education / Equipment: HEP.   Patient agrees to discharge. Patient goals were not met. Patient is being discharged due to not returning since the last visit.     Adrian Jacobson  MPT

## 2018-09-12 ENCOUNTER — Other Ambulatory Visit: Payer: Self-pay | Admitting: Family Medicine

## 2018-09-17 ENCOUNTER — Ambulatory Visit (INDEPENDENT_AMBULATORY_CARE_PROVIDER_SITE_OTHER): Payer: Managed Care, Other (non HMO) | Admitting: Family Medicine

## 2018-09-17 ENCOUNTER — Other Ambulatory Visit: Payer: Self-pay

## 2018-09-17 ENCOUNTER — Encounter: Payer: Self-pay | Admitting: Family Medicine

## 2018-09-17 DIAGNOSIS — E1169 Type 2 diabetes mellitus with other specified complication: Secondary | ICD-10-CM

## 2018-09-17 DIAGNOSIS — E782 Mixed hyperlipidemia: Secondary | ICD-10-CM

## 2018-09-17 DIAGNOSIS — I10 Essential (primary) hypertension: Secondary | ICD-10-CM | POA: Diagnosis not present

## 2018-09-17 DIAGNOSIS — K219 Gastro-esophageal reflux disease without esophagitis: Secondary | ICD-10-CM | POA: Diagnosis not present

## 2018-09-17 MED ORDER — BACLOFEN 10 MG PO TABS: 10.0000 mg | ORAL_TABLET | Freq: Three times a day (TID) | ORAL | 3 refills | Status: DC

## 2018-09-17 NOTE — Progress Notes (Signed)
Virtual Visit via telephone Note  I connected with Adrian Jacobson on 09/17/18 at 1302 by telephone and verified that I am speaking with the correct person using two identifiers. Adrian Jacobson is currently located at home and no other people are currently with her during visit. The provider, Fransisca Kaufmann Dettinger, MD is located in their office at time of visit.  Call ended at 1311  I discussed the limitations, risks, security and privacy concerns of performing an evaluation and management service by telephone and the availability of in person appointments. I also discussed with the patient that there may be a patient responsible charge related to this service. The patient expressed understanding and agreed to proceed.   History and Present Illness: Type 2 diabetes mellitus Patient comes in today for recheck of his diabetes. Patient has been currently taking metformin, and glipizide, bs between 120-150. Patient is currently on an ACE inhibitor/ARB. Patient has not seen an ophthalmologist this year. Patient denies any issues with their feet.   Hypertension Patient is currently on lisinopril, and their blood pressure today is 126/83. Patient denies any lightheadedness or dizziness. Patient denies headaches, blurred vision, chest pains, shortness of breath, or weakness. Denies any side effects from medication and is content with current medication.   Hyperlipidemia Patient is coming in for recheck of his hyperlipidemia. The patient is currently taking lipitor. They deny any issues with myalgias or history of liver damage from it. They deny any focal numbness or weakness or chest pain.   No diagnosis found.  Outpatient Encounter Medications as of 09/17/2018  Medication Sig  . atorvastatin (LIPITOR) 40 MG tablet Take 1 tablet (40 mg total) by mouth daily.  . baclofen (LIORESAL) 10 MG tablet Take 1 tablet (10 mg total) by mouth 3 (three) times daily.  Marland Kitchen dicyclomine (BENTYL) 20 MG tablet TAKE 1  TABLET BY MOUTH EVERY 6 HOURS AS NEEDED FOR SPASMS (ABDOMINAL PAIN). (Patient taking differently: Take 20 mg by mouth every 6 (six) hours as needed for spasms. SPASMS (ABDOMINAL PAIN).)  . glipiZIDE (GLUCOTROL XL) 10 MG 24 hr tablet Take 1 tablet (10 mg total) by mouth 2 (two) times daily.  Marland Kitchen lisinopril (PRINIVIL,ZESTRIL) 5 MG tablet Take 1 tablet (5 mg total) by mouth daily.  . meloxicam (MOBIC) 15 MG tablet TAKE 1 TABLET BY MOUTH EVERY DAY  . metFORMIN (GLUCOPHAGE-XR) 500 MG 24 hr tablet Take 2 tablets (1,000 mg total) by mouth 2 (two) times daily.   No facility-administered encounter medications on file as of 09/17/2018.     Review of Systems  Constitutional: Negative for chills and fever.  Eyes: Negative for visual disturbance.  Respiratory: Negative for shortness of breath and wheezing.   Cardiovascular: Negative for chest pain and leg swelling.  Gastrointestinal: Negative for abdominal pain.  Musculoskeletal: Negative for back pain and gait problem.  Skin: Negative for rash.  Neurological: Negative for dizziness, weakness, light-headedness and numbness.  All other systems reviewed and are negative.   Observations/Objective: Patient sounds comfortable and in no acute distress  Assessment and Plan: Problem List Items Addressed This Visit      Cardiovascular and Mediastinum   Essential hypertension     Digestive   Chronic GERD     Endocrine   Type 2 diabetes mellitus ([place]) - Primary     Other   HLD (hyperlipidemia)       Follow Up Instructions: Continue current medication and follow-up with me in 3 months    I discussed  the assessment and treatment plan with the patient. The patient was provided an opportunity to ask questions and all were answered. The patient agreed with the plan and demonstrated an understanding of the instructions.   The patient was advised to call back or seek an in-person evaluation if the symptoms worsen or if the condition fails to improve as  anticipated.  The above assessment and management plan was discussed with the patient. The patient verbalized understanding of and has agreed to the management plan. Patient is aware to call the clinic if symptoms persist or worsen. Patient is aware when to return to the clinic for a follow-up visit. Patient educated on when it is appropriate to go to the emergency department.    I provided 9 minutes of non-face-to-face time during this encounter.    Worthy Rancher, MD

## 2018-11-20 ENCOUNTER — Other Ambulatory Visit: Payer: Self-pay | Admitting: Family Medicine

## 2018-12-20 ENCOUNTER — Other Ambulatory Visit: Payer: Self-pay | Admitting: Family Medicine

## 2018-12-21 ENCOUNTER — Other Ambulatory Visit: Payer: Self-pay

## 2018-12-21 ENCOUNTER — Encounter: Payer: Self-pay | Admitting: Family Medicine

## 2018-12-21 ENCOUNTER — Ambulatory Visit (INDEPENDENT_AMBULATORY_CARE_PROVIDER_SITE_OTHER): Payer: Managed Care, Other (non HMO) | Admitting: Family Medicine

## 2018-12-21 VITALS — BP 112/77 | HR 80 | Temp 98.9°F | Ht 71.0 in | Wt 248.0 lb

## 2018-12-21 DIAGNOSIS — I10 Essential (primary) hypertension: Secondary | ICD-10-CM | POA: Diagnosis not present

## 2018-12-21 DIAGNOSIS — Z23 Encounter for immunization: Secondary | ICD-10-CM | POA: Diagnosis not present

## 2018-12-21 DIAGNOSIS — E782 Mixed hyperlipidemia: Secondary | ICD-10-CM | POA: Diagnosis not present

## 2018-12-21 DIAGNOSIS — E1169 Type 2 diabetes mellitus with other specified complication: Secondary | ICD-10-CM

## 2018-12-21 LAB — BAYER DCA HB A1C WAIVED: HB A1C (BAYER DCA - WAIVED): 7.4 % — ABNORMAL HIGH (ref <7.0)

## 2018-12-21 NOTE — Progress Notes (Signed)
BP 112/77   Pulse 80   Temp 98.9 F (37.2 C) (Temporal)   Ht 5' 11" (1.803 m)   Wt 248 lb (112.5 kg)   BMI 34.59 kg/m    Subjective:   [name], male    DOB: Aug 15, 1965, 53 y.o.   MRN: [id number]  HPI: Adrian Jacobson is a 53 y.o. male presenting on 12/21/2018 for Diabetes and Hypertension   HPI  Patient recently reports of loss of his father just a week ago and that is been a stressful thing for him but not overwhelming. Type 2 diabetes mellitus Patient comes in today for recheck of his diabetes. Patient has been currently taking metformin 500 extended release twice a day and glipizide 10 mg twice daily. Patient is currently on an Jacobson inhibitor/ARB. Patient has not seen an ophthalmologist this year. Patient denies any issues with their feet.   Hypertension Patient is currently on lisinopril 5, and their blood pressure today is 112/77. Patient denies any lightheadedness or dizziness. Patient denies headaches, blurred vision, chest pains, shortness of breath, or weakness. Denies any side effects from medication and is content with current medication.   Hyperlipidemia Patient is coming in for recheck of his hyperlipidemia. The patient is currently taking atorvastatin. They deny any issues with myalgias or history of liver damage from it. They deny any focal numbness or weakness or chest pain.   Relevant past medical, surgical, family and social history reviewed and updated as indicated. Interim medical history since our last visit reviewed. Allergies and medications reviewed and updated.  Review of Systems  Constitutional: Negative for chills and fever.  Respiratory: Negative for shortness of breath and wheezing.   Cardiovascular: Negative for chest pain and leg swelling.  Musculoskeletal: Negative for back pain and gait problem.  Skin: Negative for rash.  Neurological: Negative for dizziness, weakness and light-headedness.  All other systems reviewed and are  negative.   Per HPI unless specifically indicated above   Allergies as of 12/21/2018      Reactions   Strawberry Flavor Anaphylaxis   Anything with the fruit   Hydrocodone Other (See Comments)   Mood swings    Niacin And Related Other (See Comments)   Feel like skin burning    Omeprazole Other (See Comments)   Abdominal pain      Medication List       Accurate as of December 21, 2018  4:15 PM. If you have any questions, ask your nurse or doctor.        atorvastatin 40 MG tablet Commonly known as: LIPITOR Take 1 tablet (40 mg total) by mouth daily.   baclofen 10 MG tablet Commonly known as: LIORESAL Take 1 tablet (10 mg total) by mouth 3 (three) times daily.   dicyclomine 20 MG tablet Commonly known as: BENTYL TAKE 1 TABLET BY MOUTH EVERY 6 HOURS AS NEEDED FOR SPASMS (ABDOMINAL PAIN). What changed:   how much to take  how to take this  when to take this  reasons to take this  additional instructions   glipiZIDE 10 MG 24 hr tablet Commonly known as: GLUCOTROL XL Take 1 tablet (10 mg total) by mouth 2 (two) times daily.   lisinopril 5 MG tablet Commonly known as: ZESTRIL Take 1 tablet (5 mg total) by mouth daily.   meloxicam 15 MG tablet Commonly known as: MOBIC TAKE 1 TABLET BY MOUTH EVERY DAY   metFORMIN 500 MG 24 hr tablet Commonly known as: GLUCOPHAGE-XR  Take 2 tablets (1,000 mg total) by mouth 2 (two) times daily.        Objective:   BP 112/77   Pulse 80   Temp 98.9 F (37.2 C) (Temporal)   Ht 5' 11" (1.803 m)   Wt 248 lb (112.5 kg)   BMI 34.59 kg/m   Wt Readings from Last 3 Encounters:  12/21/18 248 lb (112.5 kg)  08/24/18 235 lb (106.6 kg)  08/12/18 244 lb 6.4 oz (110.9 kg)    Physical Exam Vitals signs and nursing note reviewed.  Constitutional:      General: He is not in acute distress.    Appearance: He is well-developed. He is not diaphoretic.  Eyes:     General: No scleral icterus.    Conjunctiva/sclera: Conjunctivae  normal.  Neck:     Musculoskeletal: Neck supple.     Thyroid: No thyromegaly.  Cardiovascular:     Rate and Rhythm: Normal rate and regular rhythm.     Heart sounds: Normal heart sounds. No murmur.  Pulmonary:     Effort: Pulmonary effort is normal. No respiratory distress.     Breath sounds: Normal breath sounds. No wheezing.  Musculoskeletal: Normal range of motion.  Lymphadenopathy:     Cervical: No cervical adenopathy.  Skin:    General: Skin is warm and dry.     Findings: No rash.  Neurological:     Mental Status: He is alert and oriented to person, place, and time.     Coordination: Coordination normal.  Psychiatric:        Behavior: Behavior normal.       Assessment & Plan:   Problem List Items Addressed This Visit      Cardiovascular and Mediastinum   Essential hypertension   Relevant Orders   Bayer DCA Hb A1c Waived   CBC with Differential/Platelet     Endocrine   Type 2 diabetes mellitus ([place]) - Primary   Relevant Orders   CMP14+EGFR   Bayer DCA Hb A1c Waived   CBC with Differential/Platelet     Other   HLD (hyperlipidemia)   Relevant Orders   Lipid panel      We added Januvia in the form of Janumet and gave samples of that and will replace his metformin currently, keep glipizide for now but I am concerned he might be getting some nighttime hypoglycemic episodes so we may have to back off on the glipizide if he does well with the Janumet. Follow up plan: Return in about 3 months (around 03/22/2019), or if symptoms worsen or fail to improve, for Diabetes recheck.  Counseling provided for all of the vaccine components Orders Placed This Encounter  Procedures  . CMP14+EGFR  . Bayer DCA Hb A1c Waived  . Lipid panel  . CBC with Differential/Platelet    Caryl Pina, MD [facility] 12/21/2018, 4:15 PM

## 2018-12-21 NOTE — Addendum Note (Signed)
Addended by: Karle Plumber on: 12/21/2018 04:31 PM   Modules accepted: Orders

## 2018-12-22 ENCOUNTER — Other Ambulatory Visit: Payer: Self-pay | Admitting: Family Medicine

## 2018-12-22 LAB — LIPID PANEL
Chol/HDL Ratio: 3.7 ratio (ref 0.0–5.0)
Cholesterol, Total: 114 mg/dL (ref 100–199)
HDL: 31 mg/dL — ABNORMAL LOW (ref >39)
LDL Chol Calc (NIH): 39 mg/dL (ref 0–99)
Triglycerides: 287 mg/dL — ABNORMAL HIGH (ref 0–149)
VLDL Cholesterol Cal: 44 mg/dL — ABNORMAL HIGH (ref 5–40)

## 2018-12-22 LAB — CMP14+EGFR
ALT: 32 IU/L (ref 0–44)
AST: 25 IU/L (ref 0–40)
Albumin/Globulin Ratio: 1.9 (ref 1.2–2.2)
Albumin: 4.7 g/dL (ref 3.8–4.9)
Alkaline Phosphatase: 55 IU/L (ref 39–117)
BUN/Creatinine Ratio: 15 (ref 9–20)
BUN: 16 mg/dL (ref 6–24)
Bilirubin Total: 0.3 mg/dL (ref 0.0–1.2)
CO2: 25 mmol/L (ref 20–29)
Calcium: 10 mg/dL (ref 8.7–10.2)
Chloride: 101 mmol/L (ref 96–106)
Creatinine, Ser: 1.06 mg/dL (ref 0.76–1.27)
GFR calc Af Amer: 93 mL/min/{1.73_m2} (ref >59)
GFR calc non Af Amer: 81 mL/min/{1.73_m2} (ref >59)
Globulin, Total: 2.5 g/dL (ref 1.5–4.5)
Glucose: 164 mg/dL — ABNORMAL HIGH (ref 65–99)
Potassium: 4.7 mmol/L (ref 3.5–5.2)
Sodium: 141 mmol/L (ref 134–144)
Total Protein: 7.2 g/dL (ref 6.0–8.5)

## 2018-12-22 LAB — CBC WITH DIFFERENTIAL/PLATELET
Basophils Absolute: 0.1 10*3/uL (ref 0.0–0.2)
Basos: 1 %
EOS (ABSOLUTE): 0.6 10*3/uL — ABNORMAL HIGH (ref 0.0–0.4)
Eos: 10 %
Hematocrit: 42.4 % (ref 37.5–51.0)
Hemoglobin: 14.2 g/dL (ref 13.0–17.7)
Immature Grans (Abs): 0 10*3/uL (ref 0.0–0.1)
Immature Granulocytes: 0 %
Lymphocytes Absolute: 2.1 10*3/uL (ref 0.7–3.1)
Lymphs: 33 %
MCH: 28.6 pg (ref 26.6–33.0)
MCHC: 33.5 g/dL (ref 31.5–35.7)
MCV: 85 fL (ref 79–97)
Monocytes Absolute: 0.5 10*3/uL (ref 0.1–0.9)
Monocytes: 8 %
Neutrophils Absolute: 3 10*3/uL (ref 1.4–7.0)
Neutrophils: 48 %
Platelets: 251 10*3/uL (ref 150–450)
RBC: 4.97 x10E6/uL (ref 4.14–5.80)
RDW: 12.7 % (ref 11.6–15.4)
WBC: 6.4 10*3/uL (ref 3.4–10.8)

## 2018-12-30 ENCOUNTER — Encounter: Payer: Self-pay | Admitting: *Deleted

## 2019-01-17 ENCOUNTER — Other Ambulatory Visit: Payer: Self-pay | Admitting: Family Medicine

## 2019-03-24 ENCOUNTER — Encounter: Payer: Self-pay | Admitting: Family Medicine

## 2019-03-24 ENCOUNTER — Ambulatory Visit (INDEPENDENT_AMBULATORY_CARE_PROVIDER_SITE_OTHER): Payer: Managed Care, Other (non HMO) | Admitting: Family Medicine

## 2019-03-24 DIAGNOSIS — E1169 Type 2 diabetes mellitus with other specified complication: Secondary | ICD-10-CM

## 2019-03-24 DIAGNOSIS — I10 Essential (primary) hypertension: Secondary | ICD-10-CM | POA: Diagnosis not present

## 2019-03-24 DIAGNOSIS — K219 Gastro-esophageal reflux disease without esophagitis: Secondary | ICD-10-CM

## 2019-03-24 DIAGNOSIS — E782 Mixed hyperlipidemia: Secondary | ICD-10-CM

## 2019-03-24 NOTE — Progress Notes (Signed)
Virtual Visit via telephone Note  I connected with Adrian Jacobson on 03/24/19 at 1329 by telephone and verified that I am speaking with the correct person using two identifiers. Adrian Jacobson is currently located at home and no other people are currently with her during visit. The provider, Fransisca Kaufmann Sarkis Rhines, MD is located in their office at time of visit.  Call ended at 1336  I discussed the limitations, risks, security and privacy concerns of performing an evaluation and management service by telephone and the availability of in person appointments. I also discussed with the patient that there may be a patient responsible charge related to this service. The patient expressed understanding and agreed to proceed.   History and Present Illness: Type 2 diabetes mellitus Patient comes in today for recheck of his diabetes. Patient has been currently taking metformin and janumet and glipizide, averaging 130. Patient is currently on an ACE inhibitor/ARB. Patient has not seen an ophthalmologist this year. Patient denies any issues with their feet.   Hypertension Patient is currently on lisinopril, and their blood pressure today is 110/75. Patient denies any lightheadedness or dizziness. Patient denies headaches, blurred vision, chest pains, shortness of breath, or weakness. Denies any side effects from medication and is content with current medication.   Hyperlipidemia Patient is coming in for recheck of his hyperlipidemia. The patient is currently taking lipitor. They deny any issues with myalgias or history of liver damage from it. They deny any focal numbness or weakness or chest pain.   GERD Patient is currently on no medication.  She denies any major symptoms or abdominal pain or belching or burping. She denies any blood in her stool or lightheadedness or dizziness.   No diagnosis found.  Outpatient Encounter Medications as of 03/24/2019  Medication Sig  . atorvastatin (LIPITOR) 40  MG tablet Take 1 tablet (40 mg total) by mouth daily.  . baclofen (LIORESAL) 10 MG tablet Take 1 tablet (10 mg total) by mouth 3 (three) times daily.  Marland Kitchen dicyclomine (BENTYL) 20 MG tablet TAKE 1 TABLET BY MOUTH EVERY 6 HOURS AS NEEDED FOR SPASMS (ABDOMINAL PAIN).  Marland Kitchen glipiZIDE (GLUCOTROL XL) 10 MG 24 hr tablet Take 1 tablet (10 mg total) by mouth 2 (two) times daily.  Marland Kitchen lisinopril (PRINIVIL,ZESTRIL) 5 MG tablet Take 1 tablet (5 mg total) by mouth daily.  . meloxicam (MOBIC) 15 MG tablet TAKE 1 TABLET BY MOUTH EVERY DAY  . metFORMIN (GLUCOPHAGE-XR) 500 MG 24 hr tablet Take 2 tablets (1,000 mg total) by mouth 2 (two) times daily.   No facility-administered encounter medications on file as of 03/24/2019.     Review of Systems  Constitutional: Negative for chills and fever.  Respiratory: Negative for shortness of breath and wheezing.   Cardiovascular: Negative for chest pain and leg swelling.  Musculoskeletal: Negative for back pain and gait problem.  Skin: Negative for rash.  Neurological: Negative for dizziness, weakness and light-headedness.  All other systems reviewed and are negative.   Observations/Objective: Patient sounds comfortable and in no acute distress  Assessment and Plan: Problem List Items Addressed This Visit      Cardiovascular and Mediastinum   Essential hypertension     Digestive   Chronic GERD     Endocrine   Type 2 diabetes mellitus ([place]) - Primary     Other   HLD (hyperlipidemia)       Follow Up Instructions:  Follow up 3 months and for diabetes  No change in medications  I discussed the assessment and treatment plan with the patient. The patient was provided an opportunity to ask questions and all were answered. The patient agreed with the plan and demonstrated an understanding of the instructions.   The patient was advised to call back or seek an in-person evaluation if the symptoms worsen or if the condition fails to improve as anticipated.  The  above assessment and management plan was discussed with the patient. The patient verbalized understanding of and has agreed to the management plan. Patient is aware to call the clinic if symptoms persist or worsen. Patient is aware when to return to the clinic for a follow-up visit. Patient educated on when it is appropriate to go to the emergency department.    I provided 7 minutes of non-face-to-face time during this encounter.    Worthy Rancher, MD

## 2019-04-10 ENCOUNTER — Other Ambulatory Visit: Payer: Self-pay | Admitting: Family Medicine

## 2019-06-07 ENCOUNTER — Other Ambulatory Visit: Payer: Self-pay | Admitting: Family Medicine

## 2019-07-06 ENCOUNTER — Other Ambulatory Visit: Payer: Self-pay | Admitting: Family Medicine

## 2019-08-27 ENCOUNTER — Other Ambulatory Visit: Payer: Self-pay

## 2019-08-27 ENCOUNTER — Ambulatory Visit (INDEPENDENT_AMBULATORY_CARE_PROVIDER_SITE_OTHER): Payer: Managed Care, Other (non HMO) | Admitting: Family Medicine

## 2019-08-27 ENCOUNTER — Encounter: Payer: Self-pay | Admitting: Family Medicine

## 2019-08-27 VITALS — BP 127/85 | HR 75 | Temp 97.0°F | Ht 71.0 in | Wt 253.2 lb

## 2019-08-27 DIAGNOSIS — K219 Gastro-esophageal reflux disease without esophagitis: Secondary | ICD-10-CM

## 2019-08-27 DIAGNOSIS — E1169 Type 2 diabetes mellitus with other specified complication: Secondary | ICD-10-CM | POA: Diagnosis not present

## 2019-08-27 DIAGNOSIS — E782 Mixed hyperlipidemia: Secondary | ICD-10-CM | POA: Diagnosis not present

## 2019-08-27 DIAGNOSIS — I10 Essential (primary) hypertension: Secondary | ICD-10-CM

## 2019-08-27 LAB — BAYER DCA HB A1C WAIVED: HB A1C (BAYER DCA - WAIVED): 8.7 % — ABNORMAL HIGH (ref <7.0)

## 2019-08-27 MED ORDER — LISINOPRIL 5 MG PO TABS: 5.0000 mg | ORAL_TABLET | Freq: Every day | ORAL | 3 refills | Status: DC

## 2019-08-27 MED ORDER — ONETOUCH VERIO VI STRP: ORAL_STRIP | 12 refills | Status: DC

## 2019-08-27 MED ORDER — GLIPIZIDE ER 10 MG PO TB24: 10.0000 mg | ORAL_TABLET | Freq: Two times a day (BID) | ORAL | 3 refills | Status: DC

## 2019-08-27 MED ORDER — ONETOUCH ULTRASOFT LANCETS MISC: 12 refills | Status: DC

## 2019-08-27 MED ORDER — OZEMPIC (0.25 OR 0.5 MG/DOSE) 2 MG/1.5ML ~~LOC~~ SOPN: 0.5000 mg | PEN_INJECTOR | SUBCUTANEOUS | 3 refills | Status: DC

## 2019-08-27 MED ORDER — ATORVASTATIN CALCIUM 40 MG PO TABS: 40.0000 mg | ORAL_TABLET | Freq: Every day | ORAL | 3 refills | Status: DC

## 2019-08-27 MED ORDER — METFORMIN HCL ER 500 MG PO TB24: 1000.0000 mg | ORAL_TABLET | Freq: Two times a day (BID) | ORAL | 3 refills | Status: DC

## 2019-08-27 NOTE — Progress Notes (Signed)
BP 127/85   Pulse 75   Temp (!) 97 F (36.1 C) (Temporal)   Ht '5\' 11"'  (1.803 m)   Wt 253 lb 4 oz (114.9 kg)   BMI 35.32 kg/m    Subjective:   [name], male    DOB: [birth year], 54 y.o.   MRN: [id number]  HPI: Adrian Jacobson is a 54 y.o. male presenting on 08/27/2019 for Medical Management of Chronic Issues   HPI Type 2 diabetes mellitus Patient comes in today for recheck of his diabetes. Patient has been currently taking Metformin and glipizide and A1c is 8.7. Patient is currently on an ACE inhibitor/ARB. Patient has not seen an ophthalmologist this year. Patient denies any issues with their feet. The symptom started onset as an adult hypertension and hyperlipidemia ARE RELATED TO DM   Hyperlipidemia Patient is coming in for recheck of his hyperlipidemia. The patient is currently taking atorvastatin. They deny any issues with myalgias or history of liver damage from it. They deny any focal numbness or weakness or chest pain.   Hypertension Patient is currently on lisinopril, and their blood pressure today is 127/85. Patient denies any lightheadedness or dizziness. Patient denies headaches, blurred vision, chest pains, shortness of breath, or weakness. Denies any side effects from medication and is content with current medication.   GERD Patient is currently on no medication.  She denies any major symptoms or abdominal pain or belching or burping. She denies any blood in her stool or lightheadedness or dizziness.   Relevant past medical, surgical, family and social history reviewed and updated as indicated. Interim medical history since our last visit reviewed. Allergies and medications reviewed and updated.  Review of Systems  Constitutional: Negative for chills and fever.  Eyes: Negative for visual disturbance.  Respiratory: Negative for shortness of breath and wheezing.   Cardiovascular: Negative for chest pain and leg swelling.  Gastrointestinal:  Negative for abdominal pain, constipation, diarrhea and vomiting.  Musculoskeletal: Negative for back pain and gait problem.  Skin: Negative for rash.  Neurological: Negative for dizziness, weakness and light-headedness.  All other systems reviewed and are negative.   Per HPI unless specifically indicated above   Allergies as of 08/27/2019      Reactions   Strawberry Flavor Anaphylaxis   Anything with the fruit   Hydrocodone Other (See Comments)   Mood swings    Niacin And Related Other (See Comments)   Feel like skin burning    Omeprazole Other (See Comments)   Abdominal pain      Medication List       Accurate as of Aug 27, 2019  3:49 PM. If you have any questions, ask your nurse or doctor.        atorvastatin 40 MG tablet Commonly known as: LIPITOR TAKE 1 TABLET BY MOUTH EVERY DAY   baclofen 10 MG tablet Commonly known as: LIORESAL TAKE 1 TABLET BY MOUTH THREE TIMES A DAY   dicyclomine 20 MG tablet Commonly known as: BENTYL TAKE 1 TABLET BY MOUTH EVERY 6 HOURS AS NEEDED FOR SPASMS (ABDOMINAL PAIN).   glipiZIDE 10 MG 24 hr tablet Commonly known as: GLUCOTROL XL TAKE 1 TABLET BY MOUTH TWICE A DAY   lisinopril 5 MG tablet Commonly known as: ZESTRIL TAKE 1 TABLET BY MOUTH EVERY DAY   meloxicam 15 MG tablet Commonly known as: MOBIC TAKE 1 TABLET BY MOUTH EVERY DAY   metFORMIN 500 MG 24 hr tablet Commonly known as: GLUCOPHAGE-XR TAKE  2 TABLETS BY MOUTH TWICE A DAY        Objective:   BP 127/85   Pulse 75   Temp (!) 97 F (36.1 C) (Temporal)   Ht '5\' 11"'  (1.803 m)   Wt 253 lb 4 oz (114.9 kg)   BMI 35.32 kg/m   Wt Readings from Last 3 Encounters:  08/27/19 253 lb 4 oz (114.9 kg)  12/21/18 248 lb (112.5 kg)  08/24/18 235 lb (106.6 kg)    Physical Exam Vitals and nursing note reviewed.  Constitutional:      General: He is not in acute distress.    Appearance: He is well-developed. He is not diaphoretic.  Eyes:     General: No scleral icterus.     Conjunctiva/sclera: Conjunctivae normal.  Neck:     Thyroid: No thyromegaly.  Cardiovascular:     Rate and Rhythm: Normal rate and regular rhythm.     Heart sounds: Normal heart sounds. No murmur.  Pulmonary:     Effort: Pulmonary effort is normal. No respiratory distress.     Breath sounds: Normal breath sounds. No wheezing.  Musculoskeletal:        General: Normal range of motion.     Cervical back: Neck supple.  Lymphadenopathy:     Cervical: No cervical adenopathy.  Skin:    General: Skin is warm and dry.     Findings: No rash.  Neurological:     Mental Status: He is alert and oriented to person, place, and time.     Coordination: Coordination normal.  Psychiatric:        Behavior: Behavior normal.       Assessment & Plan:   Problem List Items Addressed This Visit      Cardiovascular and Mediastinum   Essential hypertension   Relevant Medications   lisinopril (ZESTRIL) 5 MG tablet   atorvastatin (LIPITOR) 40 MG tablet   Other Relevant Orders   CMP14+EGFR     Digestive   Chronic GERD   Relevant Orders   CBC with Differential/Platelet     Endocrine   Type 2 diabetes mellitus ([place]) - Primary   Relevant Medications   lisinopril (ZESTRIL) 5 MG tablet   glipiZIDE (GLUCOTROL XL) 10 MG 24 hr tablet   atorvastatin (LIPITOR) 40 MG tablet   metFORMIN (GLUCOPHAGE-XR) 500 MG 24 hr tablet   Semaglutide,0.25 or 0.5MG/DOS, (OZEMPIC, 0.25 OR 0.5 MG/DOSE,) 2 MG/1.5ML SOPN   Other Relevant Orders   Microalbumin / creatinine urine ratio   Bayer DCA Hb A1c Waived   CBC with Differential/Platelet   CMP14+EGFR     Other   HLD (hyperlipidemia)   Relevant Medications   lisinopril (ZESTRIL) 5 MG tablet   atorvastatin (LIPITOR) 40 MG tablet   Other Relevant Orders   Lipid panel    A1c is 8.7 and we will start him on Ozempic 0.5 mg and also recommended for him to go see Lottie Dawson pharmacist.  Follow up plan: Return in about 3 months (around 11/27/2019), or if symptoms  worsen or fail to improve, for Diabetes recheck.  Counseling provided for all of the vaccine components Orders Placed This Encounter  Procedures  . Microalbumin / creatinine urine ratio  . Bayer [facility] Hb A1c [place], MD [facility] 08/27/2019, 3:49 PM

## 2019-08-28 ENCOUNTER — Encounter: Payer: Self-pay | Admitting: Family Medicine

## 2019-08-28 LAB — CBC WITH DIFFERENTIAL/PLATELET
Basophils Absolute: 0.1 10*3/uL (ref 0.0–0.2)
Basos: 1 %
EOS (ABSOLUTE): 0.6 10*3/uL — ABNORMAL HIGH (ref 0.0–0.4)
Eos: 9 %
Hematocrit: 42.1 % (ref 37.5–51.0)
Hemoglobin: 14.4 g/dL (ref 13.0–17.7)
Immature Grans (Abs): 0 10*3/uL (ref 0.0–0.1)
Immature Granulocytes: 1 %
Lymphocytes Absolute: 2.2 10*3/uL (ref 0.7–3.1)
Lymphs: 32 %
MCH: 28.5 pg (ref 26.6–33.0)
MCHC: 34.2 g/dL (ref 31.5–35.7)
MCV: 83 fL (ref 79–97)
Monocytes Absolute: 0.5 10*3/uL (ref 0.1–0.9)
Monocytes: 8 %
Neutrophils Absolute: 3.4 10*3/uL (ref 1.4–7.0)
Neutrophils: 49 %
Platelets: 222 10*3/uL (ref 150–450)
RBC: 5.06 x10E6/uL (ref 4.14–5.80)
RDW: 13.2 % (ref 11.6–15.4)
WBC: 6.9 10*3/uL (ref 3.4–10.8)

## 2019-08-28 LAB — MICROALBUMIN / CREATININE URINE RATIO
Creatinine, Urine: 128.2 mg/dL
Microalb/Creat Ratio: 17 mg/g creat (ref 0–29)
Microalbumin, Urine: 21.8 ug/mL

## 2019-08-28 LAB — LIPID PANEL
Chol/HDL Ratio: 3.4 ratio (ref 0.0–5.0)
Cholesterol, Total: 98 mg/dL — ABNORMAL LOW (ref 100–199)
HDL: 29 mg/dL — ABNORMAL LOW (ref >39)
LDL Chol Calc (NIH): 35 mg/dL (ref 0–99)
Triglycerides: 212 mg/dL — ABNORMAL HIGH (ref 0–149)
VLDL Cholesterol Cal: 34 mg/dL (ref 5–40)

## 2019-08-28 LAB — CMP14+EGFR
ALT: 44 IU/L (ref 0–44)
AST: 32 IU/L (ref 0–40)
Albumin/Globulin Ratio: 1.6 (ref 1.2–2.2)
Albumin: 4.5 g/dL (ref 3.8–4.9)
Alkaline Phosphatase: 64 IU/L (ref 39–117)
BUN/Creatinine Ratio: 13 (ref 9–20)
BUN: 15 mg/dL (ref 6–24)
Bilirubin Total: 0.5 mg/dL (ref 0.0–1.2)
CO2: 23 mmol/L (ref 20–29)
Calcium: 9.8 mg/dL (ref 8.7–10.2)
Chloride: 102 mmol/L (ref 96–106)
Creatinine, Ser: 1.14 mg/dL (ref 0.76–1.27)
GFR calc Af Amer: 86 mL/min/{1.73_m2} (ref >59)
GFR calc non Af Amer: 74 mL/min/{1.73_m2} (ref >59)
Globulin, Total: 2.8 g/dL (ref 1.5–4.5)
Glucose: 181 mg/dL — ABNORMAL HIGH (ref 65–99)
Potassium: 4.8 mmol/L (ref 3.5–5.2)
Sodium: 140 mmol/L (ref 134–144)
Total Protein: 7.3 g/dL (ref 6.0–8.5)

## 2019-10-24 ENCOUNTER — Other Ambulatory Visit: Payer: Self-pay | Admitting: Family Medicine

## 2019-12-03 ENCOUNTER — Other Ambulatory Visit: Payer: Self-pay | Admitting: Family Medicine

## 2019-12-17 ENCOUNTER — Ambulatory Visit: Payer: Managed Care, Other (non HMO) | Admitting: Family Medicine

## 2019-12-17 ENCOUNTER — Telehealth: Payer: Self-pay | Admitting: Pharmacist

## 2019-12-17 ENCOUNTER — Encounter: Payer: Self-pay | Admitting: Family Medicine

## 2019-12-17 ENCOUNTER — Other Ambulatory Visit: Payer: Self-pay

## 2019-12-17 VITALS — BP 107/76 | HR 99 | Temp 97.6°F | Ht 71.0 in | Wt 224.0 lb

## 2019-12-17 DIAGNOSIS — Z23 Encounter for immunization: Secondary | ICD-10-CM

## 2019-12-17 DIAGNOSIS — Z1159 Encounter for screening for other viral diseases: Secondary | ICD-10-CM

## 2019-12-17 DIAGNOSIS — E1169 Type 2 diabetes mellitus with other specified complication: Secondary | ICD-10-CM

## 2019-12-17 DIAGNOSIS — I10 Essential (primary) hypertension: Secondary | ICD-10-CM

## 2019-12-17 DIAGNOSIS — Z114 Encounter for screening for human immunodeficiency virus [HIV]: Secondary | ICD-10-CM

## 2019-12-17 DIAGNOSIS — E782 Mixed hyperlipidemia: Secondary | ICD-10-CM

## 2019-12-17 LAB — BAYER DCA HB A1C WAIVED: HB A1C (BAYER DCA - WAIVED): 7.6 % — ABNORMAL HIGH (ref <7.0)

## 2019-12-17 NOTE — Progress Notes (Signed)
BP 107/76   Pulse 99   Temp 97.6 F (36.4 C)   Ht 5\' 11"  (1.803 m)   Wt 224 lb (101.6 kg)   SpO2 99%   BMI 31.24 kg/m    Subjective:   [name], male    DOB: Jul 14, 1965, 54 y.o.   MRN: [id number]  HPI: Adrian Jacobson is a 54 y.o. male presenting on 12/17/2019 for Medical Management of Chronic Issues and Diabetes   HPI Type 2 diabetes mellitus Patient comes in today for recheck of his diabetes. Patient has been currently taking Metformin 1000 twice daily. Patient is currently on an ACE inhibitor/ARB. Patient has not seen an ophthalmologist this year. Patient denies any issues with their feet. The symptom started onset as an adult hypertension and hyperlipidemia ARE RELATED TO DM   Hypertension Patient is currently on lisinopril, and their blood pressure today is 107/76. Patient denies any lightheadedness or dizziness. Patient denies headaches, blurred vision, chest pains, shortness of breath, or weakness. Denies any side effects from medication and is content with current medication.   Hyperlipidemia Patient is coming in for recheck of his hyperlipidemia. The patient is currently taking atorvastatin. They deny any issues with myalgias or history of liver damage from it. They deny any focal numbness or weakness or chest pain.   Relevant past medical, surgical, family and social history reviewed and updated as indicated. Interim medical history since our last visit reviewed. Allergies and medications reviewed and updated.  Review of Systems  Constitutional: Negative for chills and fever.  Eyes: Negative for visual disturbance.  Respiratory: Negative for shortness of breath and wheezing.   Cardiovascular: Negative for chest pain and leg swelling.  Musculoskeletal: Negative for back pain and gait problem.  Skin: Negative for rash.  Neurological: Negative for dizziness, weakness and light-headedness.  All other systems reviewed and are negative.   Per HPI  unless specifically indicated above   Allergies as of 12/17/2019      Reactions   Strawberry Flavor Anaphylaxis   Anything with the fruit   Hydrocodone Other (See Comments)   Mood swings    Niacin And Related Other (See Comments)   Feel like skin burning    Omeprazole Other (See Comments)   Abdominal pain      Medication List       Accurate as of December 17, 2019  2:34 PM. If you have any questions, ask your nurse or doctor.        STOP taking these medications   glipiZIDE 10 MG 24 hr tablet Commonly known as: GLUCOTROL XL Stopped by: Fransisca Kaufmann Nancee Brownrigg, MD   Ozempic (0.25 or 0.5 MG/DOSE) 2 MG/1.5ML Sopn Generic drug: Semaglutide(0.25 or 0.5MG /DOS) Stopped by: Fransisca Kaufmann Breann Losano, MD     TAKE these medications   atorvastatin 40 MG tablet Commonly known as: LIPITOR Take 1 tablet (40 mg total) by mouth daily.   baclofen 10 MG tablet Commonly known as: LIORESAL TAKE 1 TABLET BY MOUTH THREE TIMES A DAY   dicyclomine 20 MG tablet Commonly known as: BENTYL TAKE 1 TABLET BY MOUTH EVERY 6 HOURS AS NEEDED FOR SPASMS (ABDOMINAL PAIN).   lisinopril 5 MG tablet Commonly known as: ZESTRIL Take 1 tablet (5 mg total) by mouth daily.   meloxicam 15 MG tablet Commonly known as: MOBIC TAKE 1 TABLET BY MOUTH EVERY DAY   metFORMIN 500 MG 24 hr tablet Commonly known as: GLUCOPHAGE-XR Take 2 tablets (1,000 mg total) by mouth 2 (  two) times daily.   onetouch ultrasoft lancets Use as instructed   OneTouch Verio test strip Generic drug: glucose blood Use as instructed        Objective:   BP 107/76   Pulse 99   Temp 97.6 F (36.4 C)   Ht 5\' 11"  (1.803 m)   Wt 224 lb (101.6 kg)   SpO2 99%   BMI 31.24 kg/m   Wt Readings from Last 3 Encounters:  12/17/19 224 lb (101.6 kg)  08/27/19 253 lb 4 oz (114.9 kg)  12/21/18 248 lb (112.5 kg)    Physical Exam Vitals and nursing note reviewed.  Constitutional:      General: He is not in acute distress.    Appearance: He is  well-developed. He is not diaphoretic.  Eyes:     General: No scleral icterus.    Conjunctiva/sclera: Conjunctivae normal.  Neck:     Thyroid: No thyromegaly.  Cardiovascular:     Rate and Rhythm: Normal rate and regular rhythm.     Heart sounds: Normal heart sounds. No murmur heard.   Pulmonary:     Effort: Pulmonary effort is normal. No respiratory distress.     Breath sounds: Normal breath sounds. No wheezing.  Musculoskeletal:        General: Normal range of motion.     Cervical back: Neck supple.  Lymphadenopathy:     Cervical: No cervical adenopathy.  Skin:    General: Skin is warm and dry.     Findings: No rash.  Neurological:     Mental Status: He is alert and oriented to person, place, and time.     Coordination: Coordination normal.  Psychiatric:        Behavior: Behavior normal.       Assessment & Plan:   Problem List Items Addressed This Visit      Cardiovascular and Mediastinum   Essential hypertension     Endocrine   Type 2 diabetes mellitus ([place]) - Primary   Relevant Orders   Bayer DCA Hb A1c Waived (Completed)     Other   HLD (hyperlipidemia)    Other Visit Diagnoses    Need for hepatitis C screening test       Relevant Orders   Hepatitis C antibody   Encounter for screening for HIV       Relevant Orders   HIV Antibody (routine testing w rflx)    Patient might be having rebounding, waking up higher than when he went to bed, A1c 7.6 which is much improved.  Will do a professional 2-week freestyle libre to see what is happening, for now continue current medication, follow-up with Almyra Free for recheck in 2 weeks.  Patient is seen [place] health and has lost a lot of weight, continue with that.  Patient also wants Materna vaccine and will give today.  Follow up plan: Return in about 3 months (around 03/18/2020), or if symptoms worsen or fail to improve, for Diabetes and hypertension and cholesterol recheck.  Counseling provided for all of the  vaccine components Orders Placed This Encounter  Procedures  . Bayer DCA Hb A1c Waived  . Hepatitis C antibody  . HIV Antibody (routine testing w rflx)    Caryl Pina, MD [facility] 12/17/2019, 2:34 PM

## 2019-12-17 NOTE — Telephone Encounter (Signed)
LIBRE PROFESSIONAL APPLIED  F/U IN 2 WEEKS TO READ  CBJ#[id number] A EXP 02/20/20

## 2019-12-18 LAB — HIV ANTIBODY (ROUTINE TESTING W REFLEX): HIV Screen 4th Generation wRfx: NONREACTIVE

## 2019-12-18 LAB — HEPATITIS C ANTIBODY: Hep C Virus Ab: <0.1 s/co ratio (ref 0.0–0.9)

## 2019-12-31 ENCOUNTER — Telehealth: Payer: Self-pay | Admitting: Pharmacist

## 2019-12-31 ENCOUNTER — Ambulatory Visit: Payer: Managed Care, Other (non HMO) | Admitting: Pharmacist

## 2019-12-31 MED ORDER — FREESTYLE LIBRE 2 READER DEVI: 0 refills | Status: DC

## 2019-12-31 MED ORDER — FREESTYLE LIBRE 2 SENSOR MISC: 11 refills | Status: DC

## 2019-12-31 NOTE — Telephone Encounter (Signed)
[name] called in per patient request

## 2020-01-03 ENCOUNTER — Encounter: Payer: Self-pay | Admitting: Family Medicine

## 2020-01-12 ENCOUNTER — Ambulatory Visit (INDEPENDENT_AMBULATORY_CARE_PROVIDER_SITE_OTHER): Payer: Managed Care, Other (non HMO) | Admitting: Pharmacist

## 2020-01-12 ENCOUNTER — Other Ambulatory Visit: Payer: Self-pay

## 2020-01-12 DIAGNOSIS — E119 Type 2 diabetes mellitus without complications: Secondary | ICD-10-CM | POA: Diagnosis not present

## 2020-01-12 NOTE — Progress Notes (Signed)
      01/18/2020 Name: Adrian Jacobson MRN: [id number] DOB: 02-13-1966   S:  61 yoM Presents for diabetes evaluation, education, and management Patient was referred and last seen by Primary Care Provider on 12/17/19.  Insurance coverage/medication affordability: cigna  Patient reports adherence with medications. . Current diabetes medications include: metformin o Past meds:janumet . Current hypertension medications include: lisinopril Goal 130/80 . Current hyperlipidemia medications include: atorvastatin   Patient denies hypoglycemic events.   Patient reported dietary habits: Eats 2-3 meals/day Patient is currently following [place] diet--strictly keto, with about 30g carbs per day.  He also has a [employer] manager who is working with him daily via app.  Patient-reported exercise habits: active at work  O:  Lab Results  Component Value Date   HGBA1C 7.6 (H) 12/17/2019    Lipid Panel     Component Value Date/Time   CHOL 98 (L) 08/27/2019 1626   TRIG 212 (H) 08/27/2019 1626   HDL 29 (L) 08/27/2019 1626   CHOLHDL 3.4 08/27/2019 1626   CHOLHDL 2.6 02/20/2017 1125   LDLCALC 35 08/27/2019 1626   LDLCALC 37 02/20/2017 1125        A/P:  Diabetes T2DM, patient's A1c is 7.6%. He is doing great on his [place] plan where he is only taking in around 30 carbs per day.  He has also lost weight (-19.2 lbs)on this plan and reduced his amount of medications. Patient is now hooked up to our [place] [facility] Patient is adherent with medication.  He would like to come off of diabetes medications  -Continue metformin as prescribed and diet control  -Patient's night time "highs" could potentially be coming from infrequent splurges (banana pudding, etc).  A1c overall is looking better based on most recent libre.  Ranging from 6.6 % to 7.3%  -Instructed patient to move atorvastatin to the morning  -Bp reviewed 130-80, 121/84, 122/82, 111/80  -Extensively discussed pathophysiology of diabetes,  recommended lifestyle interventions, dietary effects on blood sugar control  -Counseled on s/sx of and management of hypoglycemia  -Next A1C anticipated 02/2020.     Written patient instructions provided.  Total time in face to face counseling 28 minutes.   Follow up PCP Clinic Visit ON 02/2020.   Regina Eck, PharmD, BCPS Clinical Pharmacist, [place]  [facility] Phone [phone]

## 2020-01-15 ENCOUNTER — Encounter: Payer: Self-pay | Admitting: Family Medicine

## 2020-01-19 ENCOUNTER — Ambulatory Visit: Payer: Managed Care, Other (non HMO)

## 2020-03-13 ENCOUNTER — Ambulatory Visit (INDEPENDENT_AMBULATORY_CARE_PROVIDER_SITE_OTHER): Payer: Managed Care, Other (non HMO) | Admitting: Family Medicine

## 2020-03-13 ENCOUNTER — Other Ambulatory Visit: Payer: Self-pay

## 2020-03-13 ENCOUNTER — Encounter: Payer: Self-pay | Admitting: Family Medicine

## 2020-03-13 VITALS — BP 120/75 | HR 70 | Temp 97.0°F | Ht 71.0 in | Wt 227.0 lb

## 2020-03-13 DIAGNOSIS — I10 Essential (primary) hypertension: Secondary | ICD-10-CM

## 2020-03-13 DIAGNOSIS — E782 Mixed hyperlipidemia: Secondary | ICD-10-CM

## 2020-03-13 DIAGNOSIS — E119 Type 2 diabetes mellitus without complications: Secondary | ICD-10-CM

## 2020-03-13 DIAGNOSIS — Z23 Encounter for immunization: Secondary | ICD-10-CM

## 2020-03-13 LAB — BAYER DCA HB A1C WAIVED: HB A1C (BAYER DCA - WAIVED): 6.9 % (ref <7.0)

## 2020-03-13 MED ORDER — DICYCLOMINE HCL 20 MG PO TABS: ORAL_TABLET | ORAL | 2 refills | Status: AC

## 2020-03-13 MED ORDER — ATORVASTATIN CALCIUM 40 MG PO TABS: 40.0000 mg | ORAL_TABLET | Freq: Every day | ORAL | 3 refills | Status: DC

## 2020-03-13 MED ORDER — METFORMIN HCL ER 500 MG PO TB24: 1000.0000 mg | ORAL_TABLET | Freq: Two times a day (BID) | ORAL | 3 refills | Status: DC

## 2020-03-13 MED ORDER — LISINOPRIL 5 MG PO TABS: 5.0000 mg | ORAL_TABLET | Freq: Every day | ORAL | 3 refills | Status: DC

## 2020-03-13 MED ORDER — BACLOFEN 10 MG PO TABS: 10.0000 mg | ORAL_TABLET | Freq: Three times a day (TID) | ORAL | 3 refills | Status: DC

## 2020-03-13 MED ORDER — MELOXICAM 15 MG PO TABS: 15.0000 mg | ORAL_TABLET | Freq: Every day | ORAL | 3 refills | Status: DC

## 2020-03-13 NOTE — Progress Notes (Signed)
BP 120/75   Pulse 70   Temp (!) 97 F (36.1 C)   Ht _0  (1.803 m)   Wt 227 lb (103 kg)   SpO2 96%   BMI 31.66 kg/m    Subjective:   [name], male    DOB: 26-Mar-1966, 54 y.o.   MRN: [id number]  HPI: Adrian Jacobson is a 54 y.o. male presenting on 03/13/2020 for Medical Management of Chronic Issues and Diabetes   HPI Type 2 diabetes mellitus Patient comes in today for recheck of his diabetes. Patient has been currently taking Metformin. Patient is currently on an ACE inhibitor/ARB. Patient has seen an ophthalmologist this year. Patient denies any issues with their feet. The symptom started onset as an adult hypertension hyperlipidemia ARE RELATED TO DM   Hypertension Patient is currently on lisinopril, and their blood pressure today is 120/75. Patient denies any lightheadedness or dizziness. Patient denies headaches, blurred vision, chest pains, shortness of breath, or weakness. Denies any side effects from medication and is content with current medication.   Hyperlipidemia Patient is coming in for recheck of his hyperlipidemia. The patient is currently taking atorvastatin. They deny any issues with myalgias or history of liver damage from it. They deny any focal numbness or weakness or chest pain.   Relevant past medical, surgical, family and social history reviewed and updated as indicated. Interim medical history since our last visit reviewed. Allergies and medications reviewed and updated.  Review of Systems  Constitutional: Negative for chills and fever.  Eyes: Negative for visual disturbance.  Respiratory: Negative for shortness of breath and wheezing.   Cardiovascular: Negative for chest pain and leg swelling.  Musculoskeletal: Negative for back pain and gait problem.  Skin: Negative for rash.  Neurological: Negative for dizziness, weakness and numbness.  All other systems reviewed and are negative.   Per HPI unless specifically indicated  above   Allergies as of 03/13/2020      Reactions   Strawberry Flavor Anaphylaxis   Anything with the fruit   Ozempic (0.25 Or 0.5 Mg-dose) [semaglutide(0.25 Or 0.17m-dos)]    cramping   Hydrocodone Other (See Comments)   Mood swings    Niacin And Related Other (See Comments)   Feel like skin burning    Omeprazole Other (See Comments)   Abdominal pain      Medication List       Accurate as of March 13, 2020  2:38 PM. If you have any questions, ask your nurse or doctor.        atorvastatin 40 MG tablet Commonly known as: LIPITOR Take 1 tablet (40 mg total) by mouth daily.   baclofen 10 MG tablet Commonly known as: LIORESAL TAKE 1 TABLET BY MOUTH THREE TIMES A DAY   dicyclomine 20 MG tablet Commonly known as: BENTYL TAKE 1 TABLET BY MOUTH EVERY 6 HOURS AS NEEDED FOR SPASMS (ABDOMINAL PAIN).   FreeStyle Libre 2 Reader DAmgen IncUse to test blood sugar up to 6 times daily   FreeStyle Libre 2 Sensor Misc Use to test blood sugar up to 6 times daily   lisinopril 5 MG tablet Commonly known as: ZESTRIL Take 1 tablet (5 mg total) by mouth daily.   meloxicam 15 MG tablet Commonly known as: MOBIC TAKE 1 TABLET BY MOUTH EVERY DAY   metFORMIN 500 MG 24 hr tablet Commonly known as: GLUCOPHAGE-XR Take 2 tablets (1,000 mg total) by mouth 2 (two) times daily.   onetouch ultrasoft lancets Use  as instructed   OneTouch Verio test strip Generic drug: glucose blood Use as instructed        Objective:   BP 120/75   Pulse 70   Temp (!) 97 F (36.1 C)   Ht _0  (1.803 m)   Wt 227 lb (103 kg)   SpO2 96%   BMI 31.66 kg/m   Wt Readings from Last 3 Encounters:  03/13/20 227 lb (103 kg)  12/17/19 224 lb (101.6 kg)  08/27/19 253 lb 4 oz (114.9 kg)    Physical Exam Vitals and nursing note reviewed.  Constitutional:      General: He is not in acute distress.    Appearance: He is well-developed. He is not diaphoretic.  Eyes:     General: No scleral icterus.     Conjunctiva/sclera: Conjunctivae normal.  Neck:     Thyroid: No thyromegaly.  Cardiovascular:     Rate and Rhythm: Normal rate and regular rhythm.     Heart sounds: Normal heart sounds. No murmur heard.   Pulmonary:     Effort: Pulmonary effort is normal. No respiratory distress.     Breath sounds: Normal breath sounds. No wheezing.  Musculoskeletal:        General: Normal range of motion.     Cervical back: Neck supple.  Lymphadenopathy:     Cervical: No cervical adenopathy.  Skin:    General: Skin is warm and dry.     Findings: No rash.  Neurological:     Mental Status: He is alert and oriented to person, place, and time.     Coordination: Coordination normal.  Psychiatric:        Behavior: Behavior normal.       Assessment & Plan:   Problem List Items Addressed This Visit      Cardiovascular and Mediastinum   Essential hypertension   Relevant Medications   atorvastatin (LIPITOR) 40 MG tablet   lisinopril (ZESTRIL) 5 MG tablet   Other Relevant Orders   CMP14+EGFR     Endocrine   Type 2 diabetes mellitus ([facility]) - Primary   Relevant Medications   atorvastatin (LIPITOR) 40 MG tablet   lisinopril (ZESTRIL) 5 MG tablet   metFORMIN (GLUCOPHAGE-XR) 500 MG 24 hr tablet   Other Relevant Orders   Bayer DCA Hb A1c Waived   CBC with Differential/Platelet   CMP14+EGFR     Other   HLD (hyperlipidemia)   Relevant Medications   atorvastatin (LIPITOR) 40 MG tablet   lisinopril (ZESTRIL) 5 MG tablet   Other Relevant Orders   Lipid panel    Other Visit Diagnoses    Need for Tdap vaccination       Relevant Orders   Tdap vaccine greater than or equal to 7yo IM   Need for immunization against influenza       Relevant Orders   Flu Vaccine QUAD 36+ mos IM (Completed)      A1c 6.9, looking better going in the right direction, will continue current medicines and he will continue with the verted health plan that is helping him. Follow up plan: Return in about 3 months  (around 06/13/2020), or if symptoms worsen or fail to improve, for Diabetes and hypertension and cholesterol.  Counseling provided for all of the vaccine components Orders Placed This Encounter  Procedures  . Bayer [facility] Hb A1c Waived    Caryl Pina, MD [facility] 03/13/2020, 2:38 PM

## 2020-03-14 LAB — CBC WITH DIFFERENTIAL/PLATELET
Basophils Absolute: 0.1 10*3/uL (ref 0.0–0.2)
Basos: 1 %
EOS (ABSOLUTE): 0.8 10*3/uL — ABNORMAL HIGH (ref 0.0–0.4)
Eos: 12 %
Hematocrit: 39.5 % (ref 37.5–51.0)
Hemoglobin: 13.4 g/dL (ref 13.0–17.7)
Immature Grans (Abs): 0 10*3/uL (ref 0.0–0.1)
Immature Granulocytes: 0 %
Lymphocytes Absolute: 2.2 10*3/uL (ref 0.7–3.1)
Lymphs: 35 %
MCH: 28 pg (ref 26.6–33.0)
MCHC: 33.9 g/dL (ref 31.5–35.7)
MCV: 83 fL (ref 79–97)
Monocytes Absolute: 0.6 10*3/uL (ref 0.1–0.9)
Monocytes: 10 %
Neutrophils Absolute: 2.6 10*3/uL (ref 1.4–7.0)
Neutrophils: 42 %
Platelets: 229 10*3/uL (ref 150–450)
RBC: 4.78 x10E6/uL (ref 4.14–5.80)
RDW: 12.8 % (ref 11.6–15.4)
WBC: 6.2 10*3/uL (ref 3.4–10.8)

## 2020-03-14 LAB — CMP14+EGFR
ALT: 26 IU/L (ref 0–44)
AST: 19 IU/L (ref 0–40)
Albumin/Globulin Ratio: 1.4 (ref 1.2–2.2)
Albumin: 4 g/dL (ref 3.8–4.9)
Alkaline Phosphatase: 52 IU/L (ref 44–121)
BUN/Creatinine Ratio: 14 (ref 9–20)
BUN: 14 mg/dL (ref 6–24)
Bilirubin Total: 0.4 mg/dL (ref 0.0–1.2)
CO2: 26 mmol/L (ref 20–29)
Calcium: 9.2 mg/dL (ref 8.7–10.2)
Chloride: 101 mmol/L (ref 96–106)
Creatinine, Ser: 1.02 mg/dL (ref 0.76–1.27)
GFR calc Af Amer: 97 mL/min/{1.73_m2} (ref >59)
GFR calc non Af Amer: 84 mL/min/{1.73_m2} (ref >59)
Globulin, Total: 2.9 g/dL (ref 1.5–4.5)
Glucose: 136 mg/dL — ABNORMAL HIGH (ref 65–99)
Potassium: 4.5 mmol/L (ref 3.5–5.2)
Sodium: 137 mmol/L (ref 134–144)
Total Protein: 6.9 g/dL (ref 6.0–8.5)

## 2020-03-14 LAB — HM DIABETES EYE EXAM

## 2020-03-14 LAB — LIPID PANEL
Chol/HDL Ratio: 3 ratio (ref 0.0–5.0)
Cholesterol, Total: 88 mg/dL — ABNORMAL LOW (ref 100–199)
HDL: 29 mg/dL — ABNORMAL LOW (ref >39)
LDL Chol Calc (NIH): 37 mg/dL (ref 0–99)
Triglycerides: 119 mg/dL (ref 0–149)
VLDL Cholesterol Cal: 22 mg/dL (ref 5–40)

## 2020-04-19 ENCOUNTER — Ambulatory Visit (INDEPENDENT_AMBULATORY_CARE_PROVIDER_SITE_OTHER): Payer: Managed Care, Other (non HMO)

## 2020-04-19 ENCOUNTER — Other Ambulatory Visit: Payer: Self-pay

## 2020-04-19 DIAGNOSIS — Z23 Encounter for immunization: Secondary | ICD-10-CM

## 2020-04-19 NOTE — Progress Notes (Signed)
   Covid-19 Vaccination Clinic  Name:  Adrian Jacobson    MRN: [id number] DOB: 1965-08-24  04/19/2020  [name] was observed post Covid-19 immunization for 15 minutes without incident. He was provided with Vaccine Information Sheet and instruction to access the V-Safe system.   [name] was instructed to call 911 with any severe reactions post vaccine: Marland Kitchen Difficulty breathing  . Swelling of face and throat  . A fast heartbeat  . A bad rash all over body  . Dizziness and weakness   Immunizations Administered    Name Date Dose VIS Date Route   Moderna COVID-19 Vaccine 04/19/2020  2:58 PM 0.5 mL 02/09/2020 Intramuscular   Manufacturer: Gala Murdoch   Lot: [id number]   NDC: 38177-116-57

## 2020-05-15 ENCOUNTER — Other Ambulatory Visit: Payer: Self-pay | Admitting: Family Medicine

## 2020-05-18 ENCOUNTER — Telehealth: Payer: Self-pay | Admitting: *Deleted

## 2020-05-18 NOTE — Telephone Encounter (Signed)
The pharmacy claim for FreeStyle Libre 2 Sensor has been rejected by insurance  Please research and evaluate therapy options for this patient. You can learn more about alternative medications by calling [employer] NCPDP 2017 at [phone], or checking their online formulary

## 2020-05-19 MED ORDER — DEXCOM G6 RECEIVER DEVI: 1.0000 | Freq: Four times a day (QID) | 3 refills | Status: DC

## 2020-05-19 MED ORDER — DEXCOM G6 TRANSMITTER MISC: 1.0000 | Freq: Four times a day (QID) | 3 refills | Status: DC

## 2020-05-19 MED ORDER — DEXCOM G6 SENSOR MISC: 1.0000 | 11 refills | Status: DC

## 2020-05-19 NOTE — Telephone Encounter (Signed)
I sent Dexcom for the patient to see if it is covered instead of the [place].

## 2020-05-19 NOTE — Telephone Encounter (Signed)
Left detailed message.   

## 2020-06-14 ENCOUNTER — Other Ambulatory Visit: Payer: Self-pay

## 2020-06-14 ENCOUNTER — Ambulatory Visit (INDEPENDENT_AMBULATORY_CARE_PROVIDER_SITE_OTHER): Payer: Managed Care, Other (non HMO) | Admitting: Family Medicine

## 2020-06-14 ENCOUNTER — Encounter: Payer: Self-pay | Admitting: Family Medicine

## 2020-06-14 VITALS — BP 138/80 | HR 65 | Ht 71.0 in | Wt 230.0 lb

## 2020-06-14 DIAGNOSIS — E782 Mixed hyperlipidemia: Secondary | ICD-10-CM | POA: Diagnosis not present

## 2020-06-14 DIAGNOSIS — I10 Essential (primary) hypertension: Secondary | ICD-10-CM | POA: Diagnosis not present

## 2020-06-14 DIAGNOSIS — M5412 Radiculopathy, cervical region: Secondary | ICD-10-CM | POA: Diagnosis not present

## 2020-06-14 DIAGNOSIS — E119 Type 2 diabetes mellitus without complications: Secondary | ICD-10-CM

## 2020-06-14 LAB — BAYER DCA HB A1C WAIVED: HB A1C (BAYER DCA - WAIVED): 7.5 % — ABNORMAL HIGH (ref <7.0)

## 2020-06-14 MED ORDER — METFORMIN HCL ER 500 MG PO TB24: 1000.0000 mg | ORAL_TABLET | Freq: Two times a day (BID) | ORAL | 3 refills | Status: DC

## 2020-06-14 NOTE — Progress Notes (Signed)
BP 138/80   Pulse 65   Ht 5\' 11"  (1.803 m)   Wt 230 lb (104.3 kg)   SpO2 96%   BMI 32.08 kg/m    Subjective:   [name], male    DOB: Nov 10, 1965, 55 y.o.   MRN: [id number]  HPI: Adrian Jacobson is a 55 y.o. male presenting on 06/14/2020 for Medical Management of Chronic Issues and Diabetes   HPI Type 2 diabetes mellitus Patient comes in today for recheck of his diabetes. Patient has been currently taking Metformin 1000 twice a day, A1c is up slightly at 7.5. Patient is currently on an ACE inhibitor/ARB. Patient has not seen an ophthalmologist this year. Patient denies any issues with their feet. The symptom started onset as an adult hypertension hyperlipidemia ARE RELATED TO DM   Hypertension Patient is currently on lisinopril, and their blood pressure today is 138/80. Patient denies any lightheadedness or dizziness. Patient denies headaches, blurred vision, chest pains, shortness of breath, or weakness. Denies any side effects from medication and is content with current medication.   Hyperlipidemia Patient is coming in for recheck of his hyperlipidemia. The patient is currently taking atorvastatin. They deny any issues with myalgias or history of liver damage from it. They deny any focal numbness or weakness or chest pain.   Neck pain, patient has had continued neck pain that is off and on for 12 years, he gets his numbness shooting down into his arm and he feels like that he is starting to have some weakness in the arm as well.  Patient has been using meloxicam and baclofen feels like they do help some but are not helping as much as they were before and it feels like the weakness is starting to happen more often in that arm.  He was seen back for the same issue in April 2020 had an x-ray that showed degeneration and disc space loss height.  The pain and numbness goes down to his right arm although on the outside to his right first three fingers.  Relevant past  medical, surgical, family and social history reviewed and updated as indicated. Interim medical history since our last visit reviewed. Allergies and medications reviewed and updated.  Review of Systems  Constitutional: Negative for chills and fever.  Respiratory: Negative for shortness of breath and wheezing.   Cardiovascular: Negative for chest pain and leg swelling.  Musculoskeletal: Positive for myalgias and neck pain. Negative for back pain and gait problem.  Skin: Negative for rash.  Neurological: Positive for weakness and numbness.  All other systems reviewed and are negative.   Per HPI unless specifically indicated above   Allergies as of 06/14/2020      Reactions   Strawberry Flavor Anaphylaxis   Anything with the fruit   Ozempic (0.25 Or 0.5 Mg-dose) [semaglutide(0.25 Or 0.5mg -dos)]    cramping   Hydrocodone Other (See Comments)   Mood swings    Niacin And Related Other (See Comments)   Feel like skin burning    Omeprazole Other (See Comments)   Abdominal pain      Medication List       Accurate as of June 14, 2020  1:10 PM. If you have any questions, ask your nurse or doctor.        atorvastatin 40 MG tablet Commonly known as: LIPITOR Take 1 tablet (40 mg total) by mouth daily.   baclofen 10 MG tablet Commonly known as: LIORESAL TAKE 1 TABLET BY MOUTH  THREE TIMES A DAY   Dexcom G6 Receiver Devi 1 each by Does not apply route 4 (four) times daily.   Dexcom G6 Sensor Misc 1 each by Does not apply route once a week.   Dexcom G6 Transmitter Misc 1 each by Does not apply route 4 (four) times daily.   dicyclomine 20 MG tablet Commonly known as: BENTYL One tablet po every 6 hours prn for abdominal pain   lisinopril 5 MG tablet Commonly known as: ZESTRIL Take 1 tablet (5 mg total) by mouth daily.   meloxicam 15 MG tablet Commonly known as: MOBIC TAKE 1 TABLET BY MOUTH EVERY DAY   metFORMIN 500 MG 24 hr tablet Commonly known as:  GLUCOPHAGE-XR TAKE 2 TABLETS BY MOUTH TWICE A DAY   onetouch ultrasoft lancets Use as instructed   OneTouch Verio test strip Generic drug: glucose blood Test BS up to 6 times daily Dx E11.9        Objective:   BP 138/80   Pulse 65   Ht 5\' 11"  (1.803 m)   Wt 230 lb (104.3 kg)   SpO2 96%   BMI 32.08 kg/m   Wt Readings from Last 3 Encounters:  06/14/20 230 lb (104.3 kg)  03/13/20 227 lb (103 kg)  12/17/19 224 lb (101.6 kg)    Physical Exam Vitals and nursing note reviewed.  Constitutional:      General: He is not in acute distress.    Appearance: He is well-developed and well-nourished. He is not diaphoretic.  Eyes:     General: No scleral icterus.    Extraocular Movements: EOM normal.     Conjunctiva/sclera: Conjunctivae normal.  Neck:     Thyroid: No thyromegaly.  Cardiovascular:     Rate and Rhythm: Normal rate and regular rhythm.     Pulses: Intact distal pulses.     Heart sounds: Normal heart sounds. No murmur heard.   Pulmonary:     Effort: Pulmonary effort is normal. No respiratory distress.     Breath sounds: Normal breath sounds. No wheezing.  Musculoskeletal:        General: No swelling, tenderness, deformity or edema. Normal range of motion.     Right shoulder: Normal.     Cervical back: Neck supple. Crepitus present. No swelling, rigidity, torticollis or bony tenderness. Pain with movement present. Normal range of motion.  Lymphadenopathy:     Cervical: No cervical adenopathy.  Skin:    General: Skin is warm and dry.     Findings: No rash.  Neurological:     Mental Status: He is alert and oriented to person, place, and time.     Coordination: Coordination normal.  Psychiatric:        Mood and Affect: Mood and affect normal.        Behavior: Behavior normal.     Results for orders placed or performed in visit on 03/21/20  HM DIABETES EYE EXAM  Result Value Ref Range   HM Diabetic Eye Exam No Retinopathy No Retinopathy    Assessment &  Plan:   Problem List Items Addressed This Visit      Cardiovascular and Mediastinum   Essential hypertension     Endocrine   Type 2 diabetes mellitus ([place]) - Primary   Relevant Medications   metFORMIN (GLUCOPHAGE-XR) 500 MG 24 hr tablet   Other Relevant Orders   Bayer DCA Hb A1c Waived     Other   HLD (hyperlipidemia)    Other Visit Diagnoses  Cervical radiculopathy       Relevant Orders   MR Cervical Spine Wo Contrast      Continue Metformin, patient wants to do diet and exercise and come back in 3 months, may consider Jardiance or Farxiga in the future Follow up plan: Return in about 3 months (around 09/11/2020), or if symptoms worsen or fail to improve, for dm and htn.  Counseling provided for all of the vaccine components Orders Placed This Encounter  Procedures  . Bayer [facility] Hb A1c [place], MD [facility] 06/14/2020, 1:10 PM

## 2020-06-19 ENCOUNTER — Encounter: Payer: Self-pay | Admitting: Family Medicine

## 2020-08-03 ENCOUNTER — Ambulatory Visit (INDEPENDENT_AMBULATORY_CARE_PROVIDER_SITE_OTHER): Payer: Managed Care, Other (non HMO) | Admitting: Family Medicine

## 2020-08-03 ENCOUNTER — Encounter: Payer: Self-pay | Admitting: Family Medicine

## 2020-08-03 ENCOUNTER — Other Ambulatory Visit: Payer: Self-pay

## 2020-08-03 VITALS — BP 117/78 | HR 75 | Ht 71.0 in | Wt 231.0 lb

## 2020-08-03 DIAGNOSIS — E1169 Type 2 diabetes mellitus with other specified complication: Secondary | ICD-10-CM | POA: Diagnosis not present

## 2020-08-03 DIAGNOSIS — G8929 Other chronic pain: Secondary | ICD-10-CM

## 2020-08-03 DIAGNOSIS — M542 Cervicalgia: Secondary | ICD-10-CM | POA: Diagnosis not present

## 2020-08-03 DIAGNOSIS — I10 Essential (primary) hypertension: Secondary | ICD-10-CM | POA: Diagnosis not present

## 2020-08-03 DIAGNOSIS — E782 Mixed hyperlipidemia: Secondary | ICD-10-CM

## 2020-08-03 DIAGNOSIS — M5412 Radiculopathy, cervical region: Secondary | ICD-10-CM

## 2020-08-03 LAB — BAYER DCA HB A1C WAIVED: HB A1C (BAYER DCA - WAIVED): 6.8 % (ref <7.0)

## 2020-08-03 MED ORDER — MELOXICAM 15 MG PO TABS: 1.0000 | ORAL_TABLET | Freq: Every day | ORAL | 3 refills | Status: DC

## 2020-08-03 MED ORDER — FREESTYLE LIBRE 14 DAY SENSOR MISC: 1.0000 | 3 refills | Status: DC

## 2020-08-03 NOTE — Progress Notes (Signed)
BP 117/78   Pulse 75   Ht '5\' 11"'  (1.803 m)   Wt 231 lb (104.8 kg)   SpO2 97%   BMI 32.22 kg/m    Subjective:   [name], male    DOB: 10/10/1965, 55 y.o.   MRN: [id number]  HPI: Adrian Jacobson is a 55 y.o. male presenting on 08/03/2020 for Medical Management of Chronic Issues and Diabetes   HPI Type 2 diabetes mellitus Patient comes in today for recheck of his diabetes. Patient has been currently taking Metformin and doing [place] [facility]. Patient is currently on an ACE inhibitor/ARB. Patient has not seen an ophthalmologist this year. Patient denies any issues with their feet. The symptom started onset as an adult hyperlipidemia and hypertension ARE RELATED TO DM   Hyperlipidemia Patient is coming in for recheck of his hyperlipidemia. The patient is currently taking atorvastatin. They deny any issues with myalgias or history of liver damage from it. They deny any focal numbness or weakness or chest pain.   Hypertension Patient is currently on lisinopril, and their blood pressure today is 117/78. Patient denies any lightheadedness or dizziness. Patient denies headaches, blurred vision, chest pains, shortness of breath, or weakness. Denies any side effects from medication and is content with current medication.   Patient is having continued neck pain that radiates down both of his arms and leads to some numbness that alternates of both of his arms.  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  We had noted to 6 weeks ago will be given some anti-inflammatories and has been doing home physical therapies and TENS units and heating and ice and he has been trying to stretch it and he really has not seen any improvement to this point.  Relevant past medical, surgical, family and social history reviewed and updated as indicated. Interim medical history since our last visit  reviewed. Allergies and medications reviewed and updated.  Review of Systems  Constitutional: Negative for chills and fever.  Eyes: Negative for visual disturbance.  Respiratory: Negative for shortness of breath and wheezing.   Cardiovascular: Negative for chest pain and leg swelling.  Musculoskeletal: Positive for arthralgias and neck pain. Negative for back pain and gait problem.  Skin: Negative for rash.  Neurological: Positive for numbness. Negative for dizziness, weakness and light-headedness.  All other systems reviewed and are negative.   Per HPI unless specifically indicated above   Allergies as of 08/03/2020      Reactions   Strawberry Flavor Anaphylaxis   Anything with the fruit   Ozempic (0.25 Or 0.5 Mg-dose) [semaglutide(0.25 Or 0.69m-dos)]    cramping   Hydrocodone Other (See Comments)   Mood swings    Niacin And Related Other (See Comments)   Feel like skin burning    Omeprazole Other (See Comments)   Abdominal pain      Medication List       Accurate as of August 03, 2020  1:16 PM. If you have any questions, ask your nurse or doctor.        STOP taking these medications   Dexcom G6 Receiver Devi Stopped by: JWorthy Rancher MD   Dexcom G6 Transmitter Misc Stopped by: JFransisca KaufmannDettinger, MD     TAKE these medications   atorvastatin 40 MG tablet Commonly known as: LIPITOR Take 1 tablet (40 mg total) by mouth daily.   baclofen  10 MG tablet Commonly known as: LIORESAL TAKE 1 TABLET BY MOUTH THREE TIMES A DAY   dicyclomine 20 MG tablet Commonly known as: BENTYL One tablet po every 6 hours prn for abdominal pain   FreeStyle Libre 14 Day Sensor Misc 1 each by Does not apply route every 14 (fourteen) days. What changed: when to take this Changed by: Fransisca Kaufmann Khiya Friese, MD   lisinopril 5 MG tablet Commonly known as: ZESTRIL Take 1 tablet (5 mg total) by mouth daily.   meloxicam 15 MG tablet Commonly known as: MOBIC Take 1 tablet (15 mg  total) by mouth daily.   metFORMIN 500 MG 24 hr tablet Commonly known as: GLUCOPHAGE-XR Take 2 tablets (1,000 mg total) by mouth 2 (two) times daily.   onetouch ultrasoft lancets Use as instructed   OneTouch Verio test strip Generic drug: glucose blood Test BS up to 6 times daily Dx E11.9        Objective:   BP 117/78   Pulse 75   Ht '5\' 11"'  (1.803 m)   Wt 231 lb (104.8 kg)   SpO2 97%   BMI 32.22 kg/m   Wt Readings from Last 3 Encounters:  08/03/20 231 lb (104.8 kg)  06/14/20 230 lb (104.3 kg)  03/13/20 227 lb (103 kg)    Physical Exam Vitals and nursing note reviewed.  Constitutional:      General: He is not in acute distress.    Appearance: He is well-developed. He is not diaphoretic.  Eyes:     General: No scleral icterus.    Conjunctiva/sclera: Conjunctivae normal.  Neck:     Thyroid: No thyromegaly.  Cardiovascular:     Rate and Rhythm: Normal rate and regular rhythm.     Heart sounds: Normal heart sounds. No murmur heard.   Pulmonary:     Effort: Pulmonary effort is normal. No respiratory distress.     Breath sounds: Normal breath sounds. No wheezing.  Musculoskeletal:        General: Normal range of motion.     Cervical back: Neck supple. Tenderness (Bilateral paraspinal tenderness) and crepitus present. No swelling, deformity, spasms or bony tenderness. Pain with movement present. Normal range of motion.  Lymphadenopathy:     Cervical: No cervical adenopathy.  Skin:    General: Skin is warm and dry.     Findings: No rash.  Neurological:     Mental Status: He is alert and oriented to person, place, and time.     Motor: No weakness, atrophy or abnormal muscle tone.     Coordination: Coordination normal.  Psychiatric:        Behavior: Behavior normal.       Assessment & Plan:   Problem List Items Addressed This Visit      Cardiovascular and Mediastinum   Essential hypertension   Relevant Orders   CBC with Differential/Platelet    CMP14+EGFR     Endocrine   Type 2 diabetes mellitus ([place]) - Primary   Relevant Orders   CBC with Differential/Platelet   CMP14+EGFR   Bayer DCA Hb A1c Waived     Nervous and Auditory   Chronic radicular cervical pain   Relevant Orders   MR Cervical Spine Wo Contrast     Other   HLD (hyperlipidemia)   Relevant Orders   CBC with Differential/Platelet   CMP14+EGFR   Lipid panel   Cervical pain   Relevant Orders   MR Cervical Spine Wo Contrast      Will  order MRI for his neck because he is already tried physical therapy and been doing it at home consistently and also he has been taking anti-inflammatories and showing no improvement.  Continue other medication as it is, it sounds like his blood sugars are doing better, will check A1c and blood work. Follow up plan: Return in about 3 months (around 11/02/2020), or if symptoms worsen or fail to improve, for Diabetes and hypertension and cholesterol.  Counseling provided for all of the vaccine components Orders Placed This Encounter  Procedures  . MR Cervical Spine Wo Contrast  . CBC with Differential/Platelet  . CMP14+EGFR  . Lipid panel  . Bayer [facility] Hb A1c [place], MD [facility] 08/03/2020, 1:16 PM

## 2020-08-04 LAB — CMP14+EGFR
ALT: 24 IU/L (ref 0–44)
AST: 18 IU/L (ref 0–40)
Albumin/Globulin Ratio: 1.7 (ref 1.2–2.2)
Albumin: 4.5 g/dL (ref 3.8–4.9)
Alkaline Phosphatase: 56 IU/L (ref 44–121)
BUN/Creatinine Ratio: 15 (ref 9–20)
BUN: 18 mg/dL (ref 6–24)
Bilirubin Total: 0.4 mg/dL (ref 0.0–1.2)
CO2: 22 mmol/L (ref 20–29)
Calcium: 9.7 mg/dL (ref 8.7–10.2)
Chloride: 102 mmol/L (ref 96–106)
Creatinine, Ser: 1.21 mg/dL (ref 0.76–1.27)
Globulin, Total: 2.7 g/dL (ref 1.5–4.5)
Glucose: 128 mg/dL — ABNORMAL HIGH (ref 65–99)
Potassium: 4.9 mmol/L (ref 3.5–5.2)
Sodium: 140 mmol/L (ref 134–144)
Total Protein: 7.2 g/dL (ref 6.0–8.5)
eGFR: 72 mL/min/{1.73_m2} (ref >59)

## 2020-08-04 LAB — CBC WITH DIFFERENTIAL/PLATELET
Basophils Absolute: 0.1 10*3/uL (ref 0.0–0.2)
Basos: 1 %
EOS (ABSOLUTE): 0.8 10*3/uL — ABNORMAL HIGH (ref 0.0–0.4)
Eos: 13 %
Hematocrit: 43.8 % (ref 37.5–51.0)
Hemoglobin: 14.3 g/dL (ref 13.0–17.7)
Immature Grans (Abs): 0 10*3/uL (ref 0.0–0.1)
Immature Granulocytes: 1 %
Lymphocytes Absolute: 2 10*3/uL (ref 0.7–3.1)
Lymphs: 33 %
MCH: 28 pg (ref 26.6–33.0)
MCHC: 32.6 g/dL (ref 31.5–35.7)
MCV: 86 fL (ref 79–97)
Monocytes Absolute: 0.6 10*3/uL (ref 0.1–0.9)
Monocytes: 9 %
Neutrophils Absolute: 2.7 10*3/uL (ref 1.4–7.0)
Neutrophils: 43 %
Platelets: 246 10*3/uL (ref 150–450)
RBC: 5.1 x10E6/uL (ref 4.14–5.80)
RDW: 12.6 % (ref 11.6–15.4)
WBC: 6.2 10*3/uL (ref 3.4–10.8)

## 2020-08-04 LAB — LIPID PANEL
Chol/HDL Ratio: 4.9 ratio (ref 0.0–5.0)
Cholesterol, Total: 162 mg/dL (ref 100–199)
HDL: 33 mg/dL — ABNORMAL LOW (ref >39)
LDL Chol Calc (NIH): 99 mg/dL (ref 0–99)
Triglycerides: 173 mg/dL — ABNORMAL HIGH (ref 0–149)
VLDL Cholesterol Cal: 30 mg/dL (ref 5–40)

## 2020-08-07 ENCOUNTER — Telehealth: Payer: Self-pay

## 2020-08-07 DIAGNOSIS — E1169 Type 2 diabetes mellitus with other specified complication: Secondary | ICD-10-CM

## 2020-08-07 NOTE — Telephone Encounter (Signed)
(  Key: [id number])  Your demographic data has been sent to [facility] successfully!  PA initiated through [employer] for [employer]  Dx: E11.69

## 2020-08-08 ENCOUNTER — Other Ambulatory Visit: Payer: Self-pay

## 2020-08-08 ENCOUNTER — Other Ambulatory Visit: Payer: Self-pay | Admitting: *Deleted

## 2020-08-08 ENCOUNTER — Encounter: Payer: Self-pay | Admitting: Family Medicine

## 2020-08-08 MED ORDER — LISINOPRIL 5 MG PO TABS: 5.0000 mg | ORAL_TABLET | Freq: Every day | ORAL | 3 refills | Status: DC

## 2020-08-08 MED ORDER — LISINOPRIL 5 MG PO TABS: 5.0000 mg | ORAL_TABLET | Freq: Every day | ORAL | 0 refills | Status: DC

## 2020-08-08 MED ORDER — ONETOUCH ULTRASOFT LANCETS MISC: 12 refills | Status: DC

## 2020-08-08 NOTE — Telephone Encounter (Signed)
Question response answered and re-sent to plan

## 2020-08-10 NOTE — Telephone Encounter (Signed)
Libre PA was denied.  Formulary Alternative is Dexcom. Sent to Dr Warrick Parisian

## 2020-08-14 MED ORDER — DEXCOM G6 RECEIVER DEVI: 1.0000 | Freq: Four times a day (QID) | 3 refills | Status: DC

## 2020-08-14 MED ORDER — DEXCOM G6 SENSOR MISC: 1.0000 | 11 refills | Status: DC

## 2020-08-14 MED ORDER — DEXCOM G6 TRANSMITTER MISC: 1.0000 | Freq: Four times a day (QID) | 3 refills | Status: DC

## 2020-08-14 NOTE — Telephone Encounter (Signed)
Sent Dexcom for the patient

## 2020-08-14 NOTE — Addendum Note (Signed)
Addended by: Caryl Pina on: 08/14/2020 10:51 PM   Modules accepted: Orders

## 2020-08-16 ENCOUNTER — Encounter: Payer: Self-pay | Admitting: Family Medicine

## 2020-08-16 MED ORDER — BACLOFEN 10 MG PO TABS: 10.0000 mg | ORAL_TABLET | Freq: Three times a day (TID) | ORAL | 3 refills | Status: DC

## 2020-08-16 NOTE — Telephone Encounter (Signed)
Lmtcb.

## 2020-08-16 NOTE — Telephone Encounter (Signed)
Pt rc for nurse 

## 2020-08-17 ENCOUNTER — Telehealth: Payer: Self-pay | Admitting: *Deleted

## 2020-08-17 NOTE — Telephone Encounter (Signed)
Patient aware.

## 2020-08-17 NOTE — Telephone Encounter (Signed)
Patient is calling to check on MRI referral

## 2020-08-21 ENCOUNTER — Encounter: Payer: Self-pay | Admitting: Family Medicine

## 2020-08-25 ENCOUNTER — Telehealth: Payer: Self-pay | Admitting: Family Medicine

## 2020-09-07 ENCOUNTER — Ambulatory Visit (HOSPITAL_COMMUNITY)
Admission: RE | Admit: 2020-09-07 | Discharge: 2020-09-07 | Disposition: A | Discharge status: Home / Self Care | Payer: Managed Care, Other (non HMO) | Source: Ambulatory Visit | Attending: Family Medicine | Admitting: Family Medicine

## 2020-09-07 DIAGNOSIS — G8929 Other chronic pain: Secondary | ICD-10-CM | POA: Insufficient documentation

## 2020-09-07 DIAGNOSIS — M5412 Radiculopathy, cervical region: Secondary | ICD-10-CM | POA: Insufficient documentation

## 2020-09-07 DIAGNOSIS — M542 Cervicalgia: Secondary | ICD-10-CM | POA: Diagnosis not present

## 2020-09-08 ENCOUNTER — Other Ambulatory Visit: Payer: Self-pay | Admitting: Family Medicine

## 2020-09-08 ENCOUNTER — Telehealth: Payer: Self-pay | Admitting: Family Medicine

## 2020-09-08 DIAGNOSIS — M4802 Spinal stenosis, cervical region: Secondary | ICD-10-CM

## 2020-09-08 DIAGNOSIS — G9520 Unspecified cord compression: Secondary | ICD-10-CM

## 2020-09-08 NOTE — Progress Notes (Signed)
Placed urgent referral to neurosurgery.

## 2020-09-08 NOTE — Telephone Encounter (Signed)
Opal Sidles called from [facility] to give call report regarding MRI of Servical Spine.  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  *Report is in Epic

## 2020-09-14 ENCOUNTER — Ambulatory Visit: Payer: Managed Care, Other (non HMO) | Admitting: Family Medicine

## 2020-09-27 ENCOUNTER — Other Ambulatory Visit: Payer: Self-pay | Admitting: Neurosurgery

## 2020-09-27 NOTE — Telephone Encounter (Signed)
Pt had MRI 09/07/20

## 2020-10-20 NOTE — Progress Notes (Signed)
Surgical Instructions    Your procedure is scheduled on Thursday, July 7th,2022.  Report to [facility] Main Entrance "A" at 05:30 A.M., then check in with the Admitting office.  Call this number if you have problems the morning of surgery:  [phone]   If you have any questions prior to your surgery date call [phone]: Open Monday-Friday 8am-4pm    Remember:  Do not eat or drink after midnight the night before your surgery    Take these medicines the morning of surgery with A SIP OF WATER:  atorvastatin (LIPITOR)  baclofen (LIORESAL)  If needed:   dicyclomine (BENTYL) fluticasone (FLONASE)  As of today, STOP taking any Aspirin (unless otherwise instructed by your surgeon) Aleve, Naproxen, Ibuprofen, Motrin, Advil, Mobic, Goody's, BC's, all herbal medications, fish oil, and all vitamins.  WHAT DO I DO ABOUT MY DIABETES MEDICATION?   Do not take metFORMIN (GLUCOPHAGE-XR)  the morning of surgery.   HOW TO MANAGE YOUR DIABETES BEFORE AND AFTER SURGERY  Why is it important to control my blood sugar before and after surgery? Improving blood sugar levels before and after surgery helps healing and can limit problems. A way of improving blood sugar control is eating a healthy diet by:  Eating less sugar and carbohydrates  Increasing activity/exercise  Talking with your doctor about reaching your blood sugar goals High blood sugars (greater than 180 mg/dL) can raise your risk of infections and slow your recovery, so you will need to focus on controlling your diabetes during the weeks before surgery. Make sure that the doctor who takes care of your diabetes knows about your planned surgery including the date and location.  How do I manage my blood sugar before surgery? Check your blood sugar at least 4 times a day, starting 2 days before surgery, to make sure that the level is not too high or low.  Check your blood sugar the morning of your surgery when you wake up and  every 2 hours until you get to the Short Stay unit.  If your blood sugar is less than 70 mg/dL, you will need to treat for low blood sugar: Do not take insulin. Treat a low blood sugar (less than 70 mg/dL) with  cup of clear juice (cranberry or apple), 4 glucose tablets, OR glucose gel. Recheck blood sugar in 15 minutes after treatment (to make sure it is greater than 70 mg/dL). If your blood sugar is not greater than 70 mg/dL on recheck, call [phone] for further instructions. Report your blood sugar to the short stay nurse when you get to Short Stay.  If you are admitted to the hospital after surgery: Your blood sugar will be checked by the staff and you will probably be given insulin after surgery (instead of oral diabetes medicines) to make sure you have good blood sugar levels. The goal for blood sugar control after surgery is 80-180 mg/dL.           Do not wear jewelry  Do not wear lotions, powders, colognes, or deodorant. Do not shave 48 hours prior to surgery.  Men may shave face and neck. Do not bring valuables to the hospital. DO Not wear nail polish, gel polish, artificial nails, or any other type of covering on natural nails including finger and toenails. If patients have artificial nails, gel coating, etc. that need to be removed by a nail salon please have this removed prior to surgery or surgery may need to be canceled/delayed if the surgeon/ anesthesia  feels like the patient is unable to be adequately monitored.             [facility] is not responsible for any belongings or valuables.  Do NOT Smoke (Tobacco/Vaping) or drink Alcohol 24 hours prior to your procedure If you use a CPAP at night, you may bring all equipment for your overnight stay.   Contacts, glasses, dentures or bridgework may not be worn into surgery, please bring cases for these belongings   For patients admitted to the hospital, discharge time will be determined by your treatment team.   Patients  discharged the day of surgery will not be allowed to drive home, and someone needs to stay with them for 24 hours.  ONLY 1 SUPPORT PERSON MAY BE PRESENT WHILE YOU ARE IN SURGERY. IF YOU ARE TO BE ADMITTED ONCE YOU ARE IN YOUR ROOM YOU WILL BE ALLOWED TWO (2) VISITORS.  Minor children may have two parents present. Special consideration for safety and communication needs will be reviewed on a case by case basis.  Special instructions:    Oral Hygiene is also important to reduce your risk of infection.  Remember - BRUSH YOUR TEETH THE MORNING OF SURGERY WITH YOUR REGULAR TOOTHPASTE   [place]- Preparing For Surgery  Before surgery, you can play an important role. Because skin is not sterile, your skin needs to be as free of germs as possible. You can reduce the number of germs on your skin by washing with CHG (chlorahexidine gluconate) Soap before surgery.  CHG is an antiseptic cleaner which kills germs and bonds with the skin to continue killing germs even after washing.     Please do not use if you have an allergy to CHG or antibacterial soaps. If your skin becomes reddened/irritated stop using the CHG.  Do not shave (including legs and underarms) for at least 48 hours prior to first CHG shower. It is OK to shave your face.  Please follow these instructions carefully.     Shower the NIGHT BEFORE SURGERY and the MORNING OF SURGERY with CHG Soap.   If you chose to wash your hair, wash your hair first as usual with your normal shampoo. After you shampoo, rinse your hair and body thoroughly to remove the shampoo.  Then [employer] and genitals (private parts) with your normal soap and rinse thoroughly to remove soap.  After that Use CHG Soap as you would any other liquid soap. You can apply CHG directly to the skin and wash gently with a scrungie or a clean washcloth.   Apply the CHG Soap to your body ONLY FROM THE NECK DOWN.  Do not use on open wounds or open sores. Avoid contact with your  eyes, ears, mouth and genitals (private parts). Wash Face and genitals (private parts)  with your normal soap.   Wash thoroughly, paying special attention to the area where your surgery will be performed.  Thoroughly rinse your body with warm water from the neck down.  DO NOT shower/wash with your normal soap after using and rinsing off the CHG Soap.  Pat yourself dry with a CLEAN TOWEL.  Wear CLEAN PAJAMAS to bed the night before surgery  Place CLEAN SHEETS on your bed the night before your surgery  DO NOT SLEEP WITH PETS.   Day of Surgery:  Take a shower with CHG soap. Wear Clean/Comfortable clothing the morning of surgery Do not apply any deodorants/lotions.   Remember to brush your teeth WITH YOUR REGULAR  TOOTHPASTE.   Please read over the following fact sheets that you were given.

## 2020-10-24 ENCOUNTER — Other Ambulatory Visit: Payer: Self-pay

## 2020-10-24 ENCOUNTER — Encounter (HOSPITAL_COMMUNITY)
Admission: RE | Admit: 2020-10-24 | Discharge: 2020-10-24 | Disposition: A | Discharge status: Home / Self Care | Payer: Managed Care, Other (non HMO) | Source: Ambulatory Visit | Attending: Neurosurgery | Admitting: Neurosurgery

## 2020-10-24 ENCOUNTER — Encounter (HOSPITAL_COMMUNITY): Payer: Self-pay

## 2020-10-24 DIAGNOSIS — Z20822 Contact with and (suspected) exposure to covid-19: Secondary | ICD-10-CM | POA: Insufficient documentation

## 2020-10-24 DIAGNOSIS — Z01818 Encounter for other preprocedural examination: Secondary | ICD-10-CM | POA: Insufficient documentation

## 2020-10-24 HISTORY — DX: Personal history of urinary calculi: Z87.442

## 2020-10-24 LAB — HEMOGLOBIN A1C
Hgb A1c MFr Bld: 7.6 % — ABNORMAL HIGH (ref 4.8–5.6)
Mean Plasma Glucose: 171.42 mg/dL

## 2020-10-24 LAB — SURGICAL PCR SCREEN
MRSA, PCR: NEGATIVE
Staphylococcus aureus: POSITIVE — AB

## 2020-10-24 LAB — TYPE AND SCREEN
ABO/RH(D): O POS
Antibody Screen: NEGATIVE

## 2020-10-24 LAB — CBC
HCT: 44.3 % (ref 39.0–52.0)
Hemoglobin: 14.8 g/dL (ref 13.0–17.0)
MCH: 28.5 pg (ref 26.0–34.0)
MCHC: 33.4 g/dL (ref 30.0–36.0)
MCV: 85.4 fL (ref 80.0–100.0)
Platelets: 222 10*3/uL (ref 150–400)
RBC: 5.19 MIL/uL (ref 4.22–5.81)
RDW: 12.4 % (ref 11.5–15.5)
WBC: 6.6 10*3/uL (ref 4.0–10.5)
nRBC: 0 % (ref 0.0–0.2)

## 2020-10-24 LAB — BASIC METABOLIC PANEL
Anion gap: 9 (ref 5–15)
BUN: 19 mg/dL (ref 6–20)
CO2: 23 mmol/L (ref 22–32)
Calcium: 9.7 mg/dL (ref 8.9–10.3)
Chloride: 102 mmol/L (ref 98–111)
Creatinine, Ser: 1.02 mg/dL (ref 0.61–1.24)
GFR, Estimated: >60 mL/min (ref >60)
Glucose, Bld: 169 mg/dL — ABNORMAL HIGH (ref 70–99)
Potassium: 4.8 mmol/L (ref 3.5–5.1)
Sodium: 134 mmol/L — ABNORMAL LOW (ref 135–145)

## 2020-10-24 LAB — SARS CORONAVIRUS 2 (TAT 6-24 HRS): SARS Coronavirus 2: NEGATIVE

## 2020-10-24 LAB — GLUCOSE, CAPILLARY: Glucose-Capillary: 180 mg/dL — ABNORMAL HIGH (ref 70–99)

## 2020-10-24 NOTE — Progress Notes (Signed)
PCP - Caryl Pina, MD Cardiologist - Jenkins Rouge, MD Pt had cardiac cath in 2017. Dr. Johnsie Cancel no longer needs to follow up with pt unless symptomatic, per pt.  Chest x-ray - n/a EKG - 10/24/20 Stress Test - 11/13/15 ECHO - denies Cardiac Cath - 11/29/15  Sleep Study - diagnosed with OSA CPAP - pt does not use at home  DM- Type 2 Pt does not check CBG every day   ERAS Protcol - No  COVID TEST- 10/24/20   Anesthesia review: yes, cardiac history Adrian Jacobson and Adrian Jacobson notified  Patient denies shortness of breath, fever, cough and chest pain at PAT appointment   All instructions explained to the patient, with a verbal understanding of the material. Patient agrees to go over the instructions while at home for a better understanding. The opportunity to ask questions was provided.

## 2020-10-25 ENCOUNTER — Encounter: Payer: Self-pay | Admitting: Family Medicine

## 2020-10-25 NOTE — Anesthesia Preprocedure Evaluation (Addendum)
Anesthesia Evaluation  Patient identified by MRN, date of birth, ID band Patient awake    Reviewed: Allergy & Precautions, NPO status , Patient's Chart, lab work & pertinent test results  Airway Mallampati: III  TM Distance: >3 FB Neck ROM: Full    Dental  (+) Teeth Intact, Dental Advisory Given   Pulmonary asthma , sleep apnea ,    breath sounds clear to auscultation       Cardiovascular hypertension, Pt. on medications  Rhythm:Regular Rate:Normal     Neuro/Psych PSYCHIATRIC DISORDERS Anxiety Depression  Neuromuscular disease    GI/Hepatic Neg liver ROS, GERD  ,  Endo/Other  diabetes, Type 2, Oral Hypoglycemic Agents  Renal/GU      Musculoskeletal   Abdominal Normal abdominal exam  (+)   Peds  Hematology   Anesthesia Other Findings   Reproductive/Obstetrics                           Anesthesia Physical Anesthesia Plan  ASA: 2  Anesthesia Plan: General   Post-op Pain Management:    Induction: Intravenous  PONV Risk Score and Plan: 3 and Ondansetron, Dexamethasone and Midazolam  Airway Management Planned: Oral ETT and Video Laryngoscope Planned  Additional Equipment: None  Intra-op Plan:   Post-operative Plan: Extubation in OR  Informed Consent: I have reviewed the patients History and Physical, chart, labs and discussed the procedure including the risks, benefits and alternatives for the proposed anesthesia with the patient or authorized representative who has indicated his/her understanding and acceptance.       Plan Discussed with: CRNA  Anesthesia Plan Comments: (PAT note by Karoline Caldwell, PA-C: Cardiac eval in 2017 prior to urologic surgery. Ultimately had cath showing clean coronaries. Abnormal nuclear stress determined to be false positive. Advised to followup with cardiology PRN.   Cervical spine MRI 09/07/20 showed severe spinal canal stenosis C4 through C7.  Preop  labs reviewed, DMII suboptimally controlled A1c 7.6. Labs otherwise unremarkable.   EKG 10/24/20: NSR. Rate 64.  MRI c-spine 09/07/20: IMPRESSION: Cervical spondylosis, as outlined and with findings most notably as follows.  At C5-C6, a bulky posterior disc osteophyte complex contributes to multifactorial severe spinal canal stenosis with significant spinal cord flattening. T2 hyperintense foci within the spinal cord at this level, which may reflect myelomalacia and/or edema. Severe bilateral neural foraminal narrowing.  At C6-C7, there is multifactorial moderate/severe spinal canal stenosis with mild spinal cord flattening. Severe bilateral neural foraminal narrowing.  At C4-C5, there is multifactorial moderate/severe spinal canal stenosis within the right aspect of the spinal canal, and mild spinal cord flattening. Bilateral neural foraminal narrowing (severe right, moderate/severe left).  No more than mild spinal canal stenosis at the remaining levels. Additional sites of neural foraminal narrowing, as described and greatest on the right at C3-C4 (severe).  )      Anesthesia Quick Evaluation

## 2020-10-25 NOTE — Progress Notes (Signed)
Anesthesia Chart Review:  Cardiac eval in 2017 prior to urologic surgery. Ultimately had cath showing clean coronaries. Abnormal nuclear stress determined to be false positive. Advised to followup with cardiology PRN.   Cervical spine MRI 09/07/20 showed severe spinal canal stenosis C4 through C7.  Preop labs reviewed, DMII suboptimally controlled A1c 7.6. Labs otherwise unremarkable.   EKG 10/24/20: NSR. Rate 64.  MRI c-spine 09/07/20: IMPRESSION: Cervical spondylosis, as outlined and with findings most notably as follows.   At C5-C6, a bulky posterior disc osteophyte complex contributes to multifactorial severe spinal canal stenosis with significant spinal cord flattening. T2 hyperintense foci within the spinal cord at this level, which may reflect myelomalacia and/or edema. Severe bilateral neural foraminal narrowing.   At C6-C7, there is multifactorial moderate/severe spinal canal stenosis with mild spinal cord flattening. Severe bilateral neural foraminal narrowing.   At C4-C5, there is multifactorial moderate/severe spinal canal stenosis within the right aspect of the spinal canal, and mild spinal cord flattening. Bilateral neural foraminal narrowing (severe right, moderate/severe left).   No more than mild spinal canal stenosis at the remaining levels. Additional sites of neural foraminal narrowing, as described and greatest on the right at C3-C4 (severe).    Wynonia Musty [facility]/Anesthesiology Phone [phone] 10/25/2020 10:05 AM

## 2020-10-27 ENCOUNTER — Telehealth: Payer: Self-pay

## 2020-10-27 NOTE — Telephone Encounter (Signed)
Received fax from [facility] in [place] requesting a PA for [name]. [name] was denied in April 2022. Pt is using Dexcom.   [facility] pharmacy informed.

## 2020-10-31 ENCOUNTER — Other Ambulatory Visit: Payer: Self-pay | Admitting: Neurosurgery

## 2020-11-03 ENCOUNTER — Ambulatory Visit: Payer: Managed Care, Other (non HMO) | Admitting: Family Medicine

## 2020-11-08 ENCOUNTER — Other Ambulatory Visit (HOSPITAL_COMMUNITY)
Admission: RE | Admit: 2020-11-08 | Discharge: 2020-11-08 | Disposition: A | Discharge status: Home / Self Care | Payer: Managed Care, Other (non HMO) | Source: Ambulatory Visit | Attending: Neurosurgery | Admitting: Neurosurgery

## 2020-11-08 ENCOUNTER — Encounter (HOSPITAL_COMMUNITY): Payer: Self-pay | Admitting: Neurosurgery

## 2020-11-08 DIAGNOSIS — Z20822 Contact with and (suspected) exposure to covid-19: Secondary | ICD-10-CM | POA: Insufficient documentation

## 2020-11-08 DIAGNOSIS — Z01812 Encounter for preprocedural laboratory examination: Secondary | ICD-10-CM | POA: Insufficient documentation

## 2020-11-08 LAB — SARS CORONAVIRUS 2 (TAT 6-24 HRS): SARS Coronavirus 2: NEGATIVE

## 2020-11-08 NOTE — Progress Notes (Signed)
DUE TO COVID-19 ONLY ONE VISITOR IS ALLOWED TO COME WITH YOU AND STAY IN THE WAITING ROOM ONLY DURING PRE OP AND PROCEDURE DAY OF SURGERY.  Two VISITORS  MAY VISIT WITH YOU AFTER SURGERY IN YOUR PRIVATE ROOM DURING VISITING HOURS ONLY!  PCP - Dr Vonna Kotyk Dettinger Cardiologist - n/a  Chest x-ray - n/a EKG - 10/24/20 Stress Test - 11/13/15 ECHO - n/a Cardiac Cath - 11/29/15  Sleep Study -  Yes CPAP - does not use cpap  Fasting Blood Sugar - 150s-160s Checks Blood Sugar 6-8  times a day Patient has a Libre 14 day System  Do not take metformin on the morning of surgery.  If your blood sugar is less than 70 mg/dL, you will need to treat for low blood sugar: Treat a low blood sugar (less than 70 mg/dL) with  cup of clear juice (cranberry or apple), 4 glucose tablets, OR glucose gel. Recheck blood sugar in 15 minutes after treatment (to make sure it is greater than 70 mg/dL). If your blood sugar is not greater than 70 mg/dL on recheck, call [phone] for further instructions.  Anesthesia review: Yes  STOP now taking any Aspirin (unless otherwise instructed by your surgeon), Aleve, Naproxen, Ibuprofen, Motrin, Advil, Goody's, BC's, all herbal medications, fish oil, and all vitamins.   Coronavirus Screening Covid test is scheduled on 11/08/20 Do you have any of the following symptoms:  Cough yes/no: No Fever (>100.8F)  yes/no: No Runny nose yes/no: No Sore throat yes/no: No Difficulty breathing/shortness of breath  yes/no: No  Have you traveled in the last 14 days and where? yes/no: No  Patient verbalized understanding of instructions that were given via phone.

## 2020-11-09 ENCOUNTER — Inpatient Hospital Stay (HOSPITAL_COMMUNITY): Payer: Managed Care, Other (non HMO) | Admitting: Physician Assistant

## 2020-11-09 ENCOUNTER — Encounter (HOSPITAL_COMMUNITY): Payer: Self-pay | Admitting: Neurosurgery

## 2020-11-09 ENCOUNTER — Inpatient Hospital Stay (HOSPITAL_COMMUNITY): Payer: Managed Care, Other (non HMO)

## 2020-11-09 ENCOUNTER — Inpatient Hospital Stay (HOSPITAL_COMMUNITY)
Admission: RE | Admit: 2020-11-09 | Discharge: 2020-11-10 | DRG: 472 | Disposition: A | Discharge status: Home / Self Care | Payer: Managed Care, Other (non HMO) | Attending: Neurosurgery | Admitting: Neurosurgery

## 2020-11-09 ENCOUNTER — Inpatient Hospital Stay (HOSPITAL_COMMUNITY): Payer: Managed Care, Other (non HMO) | Admitting: Certified Registered"

## 2020-11-09 ENCOUNTER — Inpatient Hospital Stay (HOSPITAL_COMMUNITY)
Admission: RE | Disposition: A | Discharge status: Home / Self Care | Payer: Self-pay | Source: Home / Self Care | Attending: Neurosurgery

## 2020-11-09 DIAGNOSIS — Z83438 Family history of other disorder of lipoprotein metabolism and other lipidemia: Secondary | ICD-10-CM

## 2020-11-09 DIAGNOSIS — Z825 Family history of asthma and other chronic lower respiratory diseases: Secondary | ICD-10-CM | POA: Diagnosis not present

## 2020-11-09 DIAGNOSIS — J45909 Unspecified asthma, uncomplicated: Secondary | ICD-10-CM | POA: Diagnosis present

## 2020-11-09 DIAGNOSIS — Z7984 Long term (current) use of oral hypoglycemic drugs: Secondary | ICD-10-CM

## 2020-11-09 DIAGNOSIS — M5001 Cervical disc disorder with myelopathy,  high cervical region: Principal | ICD-10-CM | POA: Diagnosis present

## 2020-11-09 DIAGNOSIS — Z87892 Personal history of anaphylaxis: Secondary | ICD-10-CM

## 2020-11-09 DIAGNOSIS — Z87442 Personal history of urinary calculi: Secondary | ICD-10-CM | POA: Diagnosis not present

## 2020-11-09 DIAGNOSIS — Z885 Allergy status to narcotic agent status: Secondary | ICD-10-CM | POA: Diagnosis not present

## 2020-11-09 DIAGNOSIS — Z419 Encounter for procedure for purposes other than remedying health state, unspecified: Secondary | ICD-10-CM

## 2020-11-09 DIAGNOSIS — Z8249 Family history of ischemic heart disease and other diseases of the circulatory system: Secondary | ICD-10-CM | POA: Diagnosis not present

## 2020-11-09 DIAGNOSIS — K219 Gastro-esophageal reflux disease without esophagitis: Secondary | ICD-10-CM | POA: Diagnosis present

## 2020-11-09 DIAGNOSIS — M4802 Spinal stenosis, cervical region: Secondary | ICD-10-CM | POA: Diagnosis present

## 2020-11-09 DIAGNOSIS — M4722 Other spondylosis with radiculopathy, cervical region: Secondary | ICD-10-CM | POA: Diagnosis present

## 2020-11-09 DIAGNOSIS — Z833 Family history of diabetes mellitus: Secondary | ICD-10-CM

## 2020-11-09 DIAGNOSIS — Z79899 Other long term (current) drug therapy: Secondary | ICD-10-CM | POA: Diagnosis not present

## 2020-11-09 DIAGNOSIS — E119 Type 2 diabetes mellitus without complications: Secondary | ICD-10-CM | POA: Diagnosis present

## 2020-11-09 DIAGNOSIS — Z20822 Contact with and (suspected) exposure to covid-19: Secondary | ICD-10-CM | POA: Diagnosis present

## 2020-11-09 DIAGNOSIS — Z888 Allergy status to other drugs, medicaments and biological substances status: Secondary | ICD-10-CM

## 2020-11-09 DIAGNOSIS — M5011 Cervical disc disorder with radiculopathy,  high cervical region: Secondary | ICD-10-CM | POA: Diagnosis present

## 2020-11-09 DIAGNOSIS — I10 Essential (primary) hypertension: Secondary | ICD-10-CM | POA: Diagnosis present

## 2020-11-09 DIAGNOSIS — M4712 Other spondylosis with myelopathy, cervical region: Secondary | ICD-10-CM | POA: Diagnosis present

## 2020-11-09 DIAGNOSIS — Z91018 Allergy to other foods: Secondary | ICD-10-CM

## 2020-11-09 HISTORY — DX: Other spondylosis with myelopathy, cervical region: M47.12

## 2020-11-09 HISTORY — PX: ANTERIOR CERVICAL DECOMPRESSION/DISCECTOMY FUSION 4 LEVELS: SHX5556

## 2020-11-09 LAB — GLUCOSE, CAPILLARY
Glucose-Capillary: 121 mg/dL — ABNORMAL HIGH (ref 70–99)
Glucose-Capillary: 213 mg/dL — ABNORMAL HIGH (ref 70–99)
Glucose-Capillary: 215 mg/dL — ABNORMAL HIGH (ref 70–99)
Glucose-Capillary: 201 mg/dL — ABNORMAL HIGH (ref 70–99)

## 2020-11-09 LAB — TYPE AND SCREEN
ABO/RH(D): O POS
Antibody Screen: NEGATIVE

## 2020-11-09 SURGERY — ANTERIOR CERVICAL DECOMPRESSION/DISCECTOMY FUSION 4 LEVELS
Anesthesia: General

## 2020-11-09 MED ORDER — ROCURONIUM BROMIDE 10 MG/ML (PF) SYRINGE
PREFILLED_SYRINGE | INTRAVENOUS | Status: DC | PRN
  Administered 2020-11-09 (×3): 20 mg via INTRAVENOUS
  Administered 2020-11-09: 60 mg via INTRAVENOUS

## 2020-11-09 MED ORDER — DOCUSATE SODIUM 100 MG PO CAPS
100.0000 mg | ORAL_CAPSULE | Freq: Two times a day (BID) | ORAL | Status: DC
  Administered 2020-11-09: 100 mg via ORAL
  Filled 2020-11-09: qty 1

## 2020-11-09 MED ORDER — MIDAZOLAM HCL 2 MG/2ML IJ SOLN
INTRAMUSCULAR | Status: AC
  Filled 2020-11-09: qty 2

## 2020-11-09 MED ORDER — LACTATED RINGERS IV SOLN: INTRAVENOUS | Status: DC

## 2020-11-09 MED ORDER — CYCLOBENZAPRINE HCL 10 MG PO TABS
10.0000 mg | ORAL_TABLET | Freq: Three times a day (TID) | ORAL | Status: DC | PRN
  Administered 2020-11-09: 10 mg via ORAL
  Filled 2020-11-09: qty 1

## 2020-11-09 MED ORDER — DEXAMETHASONE SODIUM PHOSPHATE 10 MG/ML IJ SOLN
INTRAMUSCULAR | Status: DC | PRN
  Administered 2020-11-09: 10 mg via INTRAVENOUS

## 2020-11-09 MED ORDER — MORPHINE SULFATE (PF) 4 MG/ML IV SOLN: 4.0000 mg | INTRAVENOUS | Status: DC | PRN

## 2020-11-09 MED ORDER — BUPIVACAINE-EPINEPHRINE 0.5% -1:200000 IJ SOLN
INTRAMUSCULAR | Status: AC
  Filled 2020-11-09: qty 1

## 2020-11-09 MED ORDER — FLUTICASONE PROPIONATE 50 MCG/ACT NA SUSP
1.0000 | Freq: Every day | NASAL | Status: DC | PRN
  Filled 2020-11-09: qty 16

## 2020-11-09 MED ORDER — SUGAMMADEX SODIUM 200 MG/2ML IV SOLN
INTRAVENOUS | Status: DC | PRN
  Administered 2020-11-09: 200 mg via INTRAVENOUS

## 2020-11-09 MED ORDER — PROMETHAZINE HCL 25 MG/ML IJ SOLN: 6.2500 mg | INTRAMUSCULAR | Status: DC | PRN

## 2020-11-09 MED ORDER — ACETAMINOPHEN 160 MG/5ML PO SOLN: 325.0000 mg | Freq: Once | ORAL | Status: DC | PRN

## 2020-11-09 MED ORDER — BUPIVACAINE-EPINEPHRINE (PF) 0.5% -1:200000 IJ SOLN
INTRAMUSCULAR | Status: DC | PRN
  Administered 2020-11-09: 10 mL

## 2020-11-09 MED ORDER — FENTANYL CITRATE (PF) 100 MCG/2ML IJ SOLN
INTRAMUSCULAR | Status: DC | PRN
  Administered 2020-11-09 (×3): 25 ug via INTRAVENOUS
  Administered 2020-11-09: 50 ug via INTRAVENOUS
  Administered 2020-11-09: 25 ug via INTRAVENOUS
  Administered 2020-11-09 (×2): 50 ug via INTRAVENOUS

## 2020-11-09 MED ORDER — PHENOL 1.4 % MT LIQD: 1.0000 | OROMUCOSAL | Status: DC | PRN

## 2020-11-09 MED ORDER — DEXAMETHASONE 4 MG PO TABS
4.0000 mg | ORAL_TABLET | Freq: Four times a day (QID) | ORAL | Status: AC
  Administered 2020-11-09: 4 mg via ORAL
  Filled 2020-11-09: qty 1

## 2020-11-09 MED ORDER — BISACODYL 10 MG RE SUPP: 10.0000 mg | Freq: Every day | RECTAL | Status: DC | PRN

## 2020-11-09 MED ORDER — ACETAMINOPHEN 10 MG/ML IV SOLN
INTRAVENOUS | Status: AC
  Filled 2020-11-09: qty 100

## 2020-11-09 MED ORDER — HYDROMORPHONE HCL 1 MG/ML IJ SOLN
INTRAMUSCULAR | Status: AC
  Filled 2020-11-09: qty 1

## 2020-11-09 MED ORDER — DICYCLOMINE HCL 20 MG PO TABS: 20.0000 mg | ORAL_TABLET | Freq: Four times a day (QID) | ORAL | Status: DC | PRN

## 2020-11-09 MED ORDER — DEXAMETHASONE SODIUM PHOSPHATE 10 MG/ML IJ SOLN
INTRAMUSCULAR | Status: AC
  Filled 2020-11-09: qty 1

## 2020-11-09 MED ORDER — 0.9 % SODIUM CHLORIDE (POUR BTL) OPTIME
TOPICAL | Status: DC | PRN
  Administered 2020-11-09: 1000 mL

## 2020-11-09 MED ORDER — PROPOFOL 10 MG/ML IV BOLUS
INTRAVENOUS | Status: AC
  Filled 2020-11-09: qty 20

## 2020-11-09 MED ORDER — AMISULPRIDE (ANTIEMETIC) 5 MG/2ML IV SOLN: 10.0000 mg | Freq: Once | INTRAVENOUS | Status: DC | PRN

## 2020-11-09 MED ORDER — CEFAZOLIN SODIUM-DEXTROSE 2-4 GM/100ML-% IV SOLN
2.0000 g | Freq: Three times a day (TID) | INTRAVENOUS | Status: AC
Start: 2020-11-09 — End: 2020-11-10
  Administered 2020-11-09 – 2020-11-10 (×2): 2 g via INTRAVENOUS
  Filled 2020-11-09 (×2): qty 100

## 2020-11-09 MED ORDER — THROMBIN 5000 UNITS EX SOLR
CUTANEOUS | Status: AC
  Filled 2020-11-09: qty 10000

## 2020-11-09 MED ORDER — LACTATED RINGERS IV SOLN: INTRAVENOUS | Status: DC | PRN

## 2020-11-09 MED ORDER — ACETAMINOPHEN 10 MG/ML IV SOLN
INTRAVENOUS | Status: DC | PRN
  Administered 2020-11-09: 1000 mg via INTRAVENOUS

## 2020-11-09 MED ORDER — HYDROMORPHONE HCL 1 MG/ML IJ SOLN
0.2500 mg | INTRAMUSCULAR | Status: DC | PRN
  Administered 2020-11-09 (×2): 0.5 mg via INTRAVENOUS

## 2020-11-09 MED ORDER — ACETAMINOPHEN 325 MG PO TABS
650.0000 mg | ORAL_TABLET | ORAL | Status: DC | PRN
  Administered 2020-11-10: 650 mg via ORAL
  Filled 2020-11-09: qty 2

## 2020-11-09 MED ORDER — LISINOPRIL 10 MG PO TABS: 5.0000 mg | ORAL_TABLET | Freq: Every day | ORAL | Status: DC

## 2020-11-09 MED ORDER — OXYCODONE HCL 5 MG PO TABS
10.0000 mg | ORAL_TABLET | ORAL | Status: DC | PRN
Start: 2020-11-09 — End: 2020-11-10
  Administered 2020-11-09: 10 mg via ORAL
  Filled 2020-11-09 (×2): qty 2

## 2020-11-09 MED ORDER — INSULIN ASPART 100 UNIT/ML IJ SOLN
0.0000 [IU] | Freq: Three times a day (TID) | INTRAMUSCULAR | Status: DC
  Administered 2020-11-09 – 2020-11-10 (×2): 7 [IU] via SUBCUTANEOUS

## 2020-11-09 MED ORDER — MENTHOL 3 MG MT LOZG: 1.0000 | LOZENGE | OROMUCOSAL | Status: DC | PRN

## 2020-11-09 MED ORDER — ROCURONIUM BROMIDE 10 MG/ML (PF) SYRINGE
PREFILLED_SYRINGE | INTRAVENOUS | Status: AC
  Filled 2020-11-09: qty 10

## 2020-11-09 MED ORDER — ATORVASTATIN CALCIUM 10 MG PO TABS: 20.0000 mg | ORAL_TABLET | Freq: Every day | ORAL | Status: DC

## 2020-11-09 MED ORDER — ONDANSETRON HCL 4 MG PO TABS: 4.0000 mg | ORAL_TABLET | Freq: Four times a day (QID) | ORAL | Status: DC | PRN

## 2020-11-09 MED ORDER — ONDANSETRON HCL 4 MG/2ML IJ SOLN
4.0000 mg | Freq: Four times a day (QID) | INTRAMUSCULAR | Status: DC | PRN
Start: 2020-11-09 — End: 2020-11-10

## 2020-11-09 MED ORDER — FENTANYL CITRATE (PF) 250 MCG/5ML IJ SOLN
INTRAMUSCULAR | Status: AC
  Filled 2020-11-09: qty 5

## 2020-11-09 MED ORDER — PANTOPRAZOLE SODIUM 40 MG IV SOLR: 40.0000 mg | Freq: Every day | INTRAVENOUS | Status: DC

## 2020-11-09 MED ORDER — ONDANSETRON HCL 4 MG/2ML IJ SOLN
INTRAMUSCULAR | Status: DC | PRN
  Administered 2020-11-09: 4 mg via INTRAVENOUS

## 2020-11-09 MED ORDER — CEFAZOLIN SODIUM-DEXTROSE 2-4 GM/100ML-% IV SOLN
2.0000 g | INTRAVENOUS | Status: AC
  Administered 2020-11-09 (×2): 2 g via INTRAVENOUS
  Filled 2020-11-09: qty 100

## 2020-11-09 MED ORDER — MEPERIDINE HCL 25 MG/ML IJ SOLN: 6.2500 mg | INTRAMUSCULAR | Status: DC | PRN

## 2020-11-09 MED ORDER — METFORMIN HCL ER 500 MG PO TB24
1000.0000 mg | ORAL_TABLET | Freq: Two times a day (BID) | ORAL | Status: DC
  Administered 2020-11-09 – 2020-11-10 (×2): 1000 mg via ORAL
  Filled 2020-11-09 (×2): qty 2

## 2020-11-09 MED ORDER — LIDOCAINE 2% (20 MG/ML) 5 ML SYRINGE
INTRAMUSCULAR | Status: DC | PRN
  Administered 2020-11-09: 40 mg via INTRAVENOUS

## 2020-11-09 MED ORDER — THROMBIN 5000 UNITS EX SOLR: OROMUCOSAL | Status: DC | PRN

## 2020-11-09 MED ORDER — ACETAMINOPHEN 650 MG RE SUPP: 650.0000 mg | RECTAL | Status: DC | PRN

## 2020-11-09 MED ORDER — PHENYLEPHRINE HCL-NACL 10-0.9 MG/250ML-% IV SOLN
INTRAVENOUS | Status: DC | PRN
  Administered 2020-11-09: 20 ug/min via INTRAVENOUS

## 2020-11-09 MED ORDER — ACETAMINOPHEN 325 MG PO TABS: 325.0000 mg | ORAL_TABLET | Freq: Once | ORAL | Status: DC | PRN

## 2020-11-09 MED ORDER — ONDANSETRON HCL 4 MG/2ML IJ SOLN
INTRAMUSCULAR | Status: AC
  Filled 2020-11-09: qty 2

## 2020-11-09 MED ORDER — ACETAMINOPHEN 500 MG PO TABS
1000.0000 mg | ORAL_TABLET | Freq: Four times a day (QID) | ORAL | Status: DC
  Administered 2020-11-09 (×2): 1000 mg via ORAL
  Filled 2020-11-09 (×2): qty 2

## 2020-11-09 MED ORDER — DEXAMETHASONE SODIUM PHOSPHATE 4 MG/ML IJ SOLN
4.0000 mg | Freq: Four times a day (QID) | INTRAMUSCULAR | Status: AC
  Administered 2020-11-10: 4 mg via INTRAVENOUS
  Filled 2020-11-09: qty 1

## 2020-11-09 MED ORDER — ORAL CARE MOUTH RINSE
15.0000 mL | Freq: Once | OROMUCOSAL | Status: AC
Start: 2020-11-09 — End: 2020-11-09

## 2020-11-09 MED ORDER — PROPOFOL 10 MG/ML IV BOLUS
INTRAVENOUS | Status: DC | PRN
  Administered 2020-11-09 (×2): 80 mg via INTRAVENOUS

## 2020-11-09 MED ORDER — PANTOPRAZOLE SODIUM 40 MG PO TBEC
40.0000 mg | DELAYED_RELEASE_TABLET | Freq: Every day | ORAL | Status: DC
  Filled 2020-11-09: qty 1

## 2020-11-09 MED ORDER — ALUM & MAG HYDROXIDE-SIMETH 200-200-20 MG/5ML PO SUSP: 30.0000 mL | Freq: Four times a day (QID) | ORAL | Status: DC | PRN

## 2020-11-09 MED ORDER — CHLORHEXIDINE GLUCONATE 0.12 % MT SOLN
15.0000 mL | Freq: Once | OROMUCOSAL | Status: AC
  Administered 2020-11-09: 15 mL via OROMUCOSAL
  Filled 2020-11-09: qty 15

## 2020-11-09 MED ORDER — MIDAZOLAM HCL 2 MG/2ML IJ SOLN
INTRAMUSCULAR | Status: DC | PRN
  Administered 2020-11-09 (×2): 1 mg via INTRAVENOUS

## 2020-11-09 MED ORDER — BACITRACIN ZINC 500 UNIT/GM EX OINT
TOPICAL_OINTMENT | CUTANEOUS | Status: AC
  Filled 2020-11-09: qty 28.35

## 2020-11-09 MED ORDER — CHLORHEXIDINE GLUCONATE CLOTH 2 % EX PADS: 6.0000 | MEDICATED_PAD | Freq: Once | CUTANEOUS | Status: DC

## 2020-11-09 MED ORDER — BACLOFEN 10 MG PO TABS
10.0000 mg | ORAL_TABLET | Freq: Two times a day (BID) | ORAL | Status: DC
  Administered 2020-11-09: 10 mg via ORAL
  Filled 2020-11-09: qty 1

## 2020-11-09 MED ORDER — ACETAMINOPHEN 10 MG/ML IV SOLN: 1000.0000 mg | Freq: Once | INTRAVENOUS | Status: DC | PRN

## 2020-11-09 MED ORDER — OXYCODONE HCL 5 MG PO TABS: 5.0000 mg | ORAL_TABLET | ORAL | Status: DC | PRN

## 2020-11-09 SURGICAL SUPPLY — 59 items
BAG COUNTER SPONGE SURGICOUNT (BAG) ×2 IMPLANT
BAND RUBBER #18 3X1/16 STRL (MISCELLANEOUS) IMPLANT
BENZOIN TINCTURE PRP APPL 2/3 (GAUZE/BANDAGES/DRESSINGS) ×2 IMPLANT
BIT DRILL NEURO 2X3.1 SFT TUCH (MISCELLANEOUS) ×1 IMPLANT
BLADE SURG 15 STRL LF DISP TIS (BLADE) ×2 IMPLANT
BLADE SURG 15 STRL SS (BLADE) ×2
BLADE ULTRA TIP 2M (BLADE) ×2 IMPLANT
BUR BARREL STRAIGHT FLUTE 4.0 (BURR) ×2 IMPLANT
BUR MATCHSTICK NEURO 3.0 LAGG (BURR) ×2 IMPLANT
CANISTER SUCT 3000ML PPV (MISCELLANEOUS) ×2 IMPLANT
CARTRIDGE OIL MAESTRO DRILL (MISCELLANEOUS) ×1 IMPLANT
COVER MAYO STAND STRL (DRAPES) ×2 IMPLANT
DECANTER SPIKE VIAL GLASS SM (MISCELLANEOUS) ×2 IMPLANT
DIFFUSER DRILL AIR PNEUMATIC (MISCELLANEOUS) ×2 IMPLANT
DRAIN JACKSON PRATT 10MM FLAT (MISCELLANEOUS) ×2 IMPLANT
DRAPE HALF SHEET 40X57 (DRAPES) ×2 IMPLANT
DRAPE LAPAROTOMY 100X72 PEDS (DRAPES) ×2 IMPLANT
DRAPE MICROSCOPE LEICA (MISCELLANEOUS) IMPLANT
DRAPE SURG 17X23 STRL (DRAPES) ×4 IMPLANT
DRILL NEURO 2X3.1 SOFT TOUCH (MISCELLANEOUS) ×2
DRSG OPSITE POSTOP 3X4 (GAUZE/BANDAGES/DRESSINGS) ×2 IMPLANT
DRSG OPSITE POSTOP 4X6 (GAUZE/BANDAGES/DRESSINGS) ×2 IMPLANT
ELECT REM PT RETURN 9FT ADLT (ELECTROSURGICAL) ×2
ELECTRODE REM PT RTRN 9FT ADLT (ELECTROSURGICAL) ×1 IMPLANT
EVACUATOR SILICONE 100CC (DRAIN) ×2 IMPLANT
GAUZE 4X4 16PLY ~~LOC~~+RFID DBL (SPONGE) IMPLANT
GLOVE EXAM NITRILE XL STR (GLOVE) IMPLANT
GLOVE SURG ENC MOIS LTX SZ8 (GLOVE) ×2 IMPLANT
GLOVE SURG ENC MOIS LTX SZ8.5 (GLOVE) ×2 IMPLANT
GLOVE SURG UNDER POLY LF SZ7.5 (GLOVE) ×6 IMPLANT
GOWN STRL REUS W/ TWL LRG LVL3 (GOWN DISPOSABLE) IMPLANT
GOWN STRL REUS W/ TWL XL LVL3 (GOWN DISPOSABLE) ×3 IMPLANT
GOWN STRL REUS W/TWL LRG LVL3 (GOWN DISPOSABLE)
GOWN STRL REUS W/TWL XL LVL3 (GOWN DISPOSABLE) ×3
HEMOSTAT POWDER KIT SURGIFOAM (HEMOSTASIS) ×4 IMPLANT
KIT BASIN OR (CUSTOM PROCEDURE TRAY) ×2 IMPLANT
KIT TURNOVER KIT B (KITS) ×2 IMPLANT
MARKER SKIN DUAL TIP RULER LAB (MISCELLANEOUS) ×4 IMPLANT
NEEDLE HYPO 22GX1.5 SAFETY (NEEDLE) ×2 IMPLANT
NEEDLE SPNL 18GX3.5 QUINCKE PK (NEEDLE) ×2 IMPLANT
NS IRRIG 1000ML POUR BTL (IV SOLUTION) ×2 IMPLANT
OIL CARTRIDGE MAESTRO DRILL (MISCELLANEOUS) ×2
PACK LAMINECTOMY NEURO (CUSTOM PROCEDURE TRAY) ×2 IMPLANT
PATTIES SURGICAL 1X1 (DISPOSABLE) ×2 IMPLANT
PEEK VISTA 14X14X7MM (Peek) ×6 IMPLANT
PIN DISTRACTION 14MM (PIN) ×4 IMPLANT
PLATE ANT CERV XTEND 4 LV 69 (Plate) ×2 IMPLANT
PUTTY DBM 5CC CALC GRAN (Putty) ×2 IMPLANT
SCREW XTD VAR 4.2 SELF TAP (Screw) ×20 IMPLANT
SPACER HEDRON C 12X14X7 0D (Spacer) ×2 IMPLANT
SPONGE INTESTINAL PEANUT (DISPOSABLE) ×4 IMPLANT
SPONGE SURGIFOAM ABS GEL 100 (HEMOSTASIS) IMPLANT
STRIP CLOSURE SKIN 1/2X4 (GAUZE/BANDAGES/DRESSINGS) ×2 IMPLANT
SUT VIC AB 0 CT1 27 (SUTURE) ×1
SUT VIC AB 0 CT1 27XBRD ANTBC (SUTURE) ×1 IMPLANT
SUT VIC AB 3-0 SH 8-18 (SUTURE) ×2 IMPLANT
TOWEL GREEN STERILE (TOWEL DISPOSABLE) ×2 IMPLANT
TOWEL GREEN STERILE FF (TOWEL DISPOSABLE) ×2 IMPLANT
WATER STERILE IRR 1000ML POUR (IV SOLUTION) ×2 IMPLANT

## 2020-11-09 NOTE — Transfer of Care (Signed)
Immediate Anesthesia Transfer of Care Note  Patient: Adrian Jacobson  Procedure(s) Performed: Anterior Cervical Discectomy and Fusion with Interbody Prosthesis, Plate and Screw Cervical Three-Four/Four-Five/Five-Six/Six-Seven  Patient Location: PACU  Anesthesia Type:General  Level of Consciousness: awake and drowsy  Airway & Oxygen Therapy: Patient Spontanous Breathing and Patient connected to face mask oxygen  Post-op Assessment: Report given to RN and Post -op Vital signs reviewed and stable  Post vital signs: Reviewed and stable  Last Vitals:  Vitals Value Taken Time  BP    Temp    Pulse    Resp    SpO2      Last Pain:  Vitals:   11/09/20 0621  TempSrc:   PainSc: 2       Patients Stated Pain Goal: 5 (38/88/75 7972)  Complications: No notable events documented.

## 2020-11-09 NOTE — Anesthesia Postprocedure Evaluation (Signed)
Anesthesia Post Note  Patient: Adrian Jacobson  Procedure(s) Performed: Anterior Cervical Discectomy and Fusion with Interbody Prosthesis, Plate and Screw Cervical Three-Four/Four-Five/Five-Six/Six-Seven     Patient location during evaluation: PACU Anesthesia Type: General Level of consciousness: awake and alert Pain management: pain level controlled Vital Signs Assessment: post-procedure vital signs reviewed and stable Respiratory status: spontaneous breathing, nonlabored ventilation, respiratory function stable and patient connected to nasal cannula oxygen Cardiovascular status: blood pressure returned to baseline and stable Postop Assessment: no apparent nausea or vomiting Anesthetic complications: no   No notable events documented.  Last Vitals:  Vitals:   11/09/20 1445 11/09/20 1548  BP: (!) 125/98 115/79  Pulse: 73 75  Resp: 10 20  Temp: (!) 36.1 C 36.4 C  SpO2: 98% 94%    Last Pain:  Vitals:   11/09/20 1548  TempSrc: Oral  PainSc:                  Adrian Jacobson

## 2020-11-09 NOTE — Progress Notes (Signed)
Orthopedic Tech Progress Note Patient Details:  Adrian Jacobson 1966-02-11 [id number] Aspen collar was delivered to patient's room for application.  Ortho Devices Type of Ortho Device: Aspen cervical collar Ortho Device/Splint Location: Neck Ortho Device/Splint Interventions: Ordered      Kenisha Lynds E Edsel Shives 11/09/2020, 4:20 PM

## 2020-11-09 NOTE — Anesthesia Procedure Notes (Signed)
Procedure Name: Intubation Date/Time: 11/09/2020 8:00 AM Performed by: Asher Muir, CRNA Pre-anesthesia Checklist: Patient identified, Emergency Drugs available, Suction available and Patient being monitored Patient Re-evaluated:Patient Re-evaluated prior to induction Oxygen Delivery Method: Circle system utilized and Simple face mask Preoxygenation: Pre-oxygenation with 100% oxygen Induction Type: IV induction Ventilation: Mask ventilation without difficulty and Two handed mask ventilation required Laryngoscope Size: 3, Glidescope and 4 (Lopro 4) Grade View: Grade I Tube type: Oral Tube size: 7.0 mm Number of attempts: 1 Airway Equipment and Method: Stylet, Video-laryngoscopy and Bite block (soft gauze bit block) Placement Confirmation: ETT inserted through vocal cords under direct vision, positive ETCO2 and breath sounds checked- equal and bilateral Secured at: 20 (right lip) cm Tube secured with: Tape Dental Injury: Teeth and Oropharynx as per pre-operative assessment

## 2020-11-09 NOTE — Op Note (Signed)
Brief history: The patient is a 55 year old white male who has complained of neck and right greater left arm pain consistent with cervical radiculopathy/cervical myelopathy.  He was worked up with a cervical MRI which demonstrated multilevel spondylosis stenosis, etc.  I discussed the various treatment options with him.  He has decided proceed with surgery.  Preoperative diagnosis: C3-4, C4-5, C5-6, C6-7 disc degeneration, spondylosis, stenosis, cervical radiculopathy, cervical myelopathy, cervicalgia  Postoperative diagnosis: The same  Procedure: C3-4, C4-5, C5-6 and C6-7 anterior cervical discectomy/decompression; C3-4, C4-5, C5-6 and C6-7 interbody arthrodesis with local morcellized autograft bone and Zimmer DBM; insertion of interbody prosthesis at C3-4, C4-5, C5-6 and C6-7 (Zimmer peek and titanium interbody prosthesis); anterior cervical plating from C3-C7 with globus titanium plate  Surgeon: Dr. Earle Gell  Asst.: Arnetha Massy, NP  Anesthesia: Gen. endotracheal  Estimated blood loss: 150 cc  Drains: 1 Jackson-Pratt drain in the prevertebral space  Complications: None  Description of procedure: The patient was brought to the operating room by the anesthesia team. General endotracheal anesthesia was induced. A roll was placed under the patient's shoulders to keep the neck in the neutral position. The patient's anterior cervical region was then prepared with Betadine scrub and Betadine solution. Sterile drapes were applied.  The area to be incised was then injected with Marcaine with epinephrine solution. I then used a scalpel to make a transverse incision in the patient's left anterior neck. I used the Metzenbaum scissors to divide the platysmal muscle and then to dissect medial to the sternocleidomastoid muscle, jugular vein, and carotid artery. I carefully dissected down towards the anterior cervical spine identifying the esophagus and retracting it medially. Then using Kitner swabs  to clear soft tissue from the anterior cervical spine. We then inserted a bent spinal needle into the upper exposed intervertebral disc space. We then obtained intraoperative radiographs confirm our location.  I then used electrocautery to detach the medial border of the longus colli muscle bilaterally from the C3-4, C4-5, C5-6 and C6-7 intervertebral disc spaces. I then inserted the Caspar self-retaining retractor underneath the longus colli muscle bilaterally to provide exposure.  We then incised the intervertebral disc at C5-6. We then performed a partial intervertebral discectomy with a pituitary forceps and the Karlin curettes. I then inserted distraction screws into the vertebral bodies at C5 and C6. We then distracted the interspace. We then used the high-speed drill to decorticate the vertebral endplates at H4-1, to drill away the remainder of the intervertebral disc, to drill away some posterior spondylosis, and to thin out the posterior longitudinal ligament. I then incised ligament with the arachnoid knife. We then removed the ligament with a Kerrison punches undercutting the vertebral endplates and decompressing the thecal sac. We then performed foraminotomies about the bilateral C6 nerve roots. This completed the decompression at this level.  We then repeated this procedure in analogous fashion at C6-7, C4-5, and C3-4 decompressing the thecal sac and the bilateral C7, C5 and C4 nerve roots.  We now turned our to attention to the interbody fusion. We used the trial spacers to determine the appropriate size for the interbody prosthesis. We then pre-filled prosthesis with a combination of local morcellized autograft bone that we obtained during decompression as well as Zimmer DBM. We then inserted the prosthesis into the distracted interspace at C3-4, C4-5, C5-6 and C6-7. We then removed the distraction screws. There was a good snug fit of the prosthesis in the interspace.  Having completed the  fusion we now turned attention  to the anterior spinal instrumentation. We used the high-speed drill to drill away some anterior spondylosis at the disc spaces so that the plate lay down flat. We selected the appropriate length titanium anterior cervical plate. We laid it along the anterior aspect of the vertebral bodies from C3-C7. We then drilled 14 mm holes at C3, C4, C5, C6 and C7. We then secured the plate to the vertebral bodies by placing two 14 mm self-tapping screws at C3, C4, C5, C6 and C7. We then obtained intraoperative radiograph. The demonstrating good position of the instrumentation. We therefore secured the screws the plate the locking each cam. This completed the instrumentation.  We then obtained hemostasis using bipolar electrocautery. We irrigated the wound out with bacitracin solution. We then removed the retractor. We inspected the esophagus for any damage. There was none apparent.  We placed a 10 mm flat Jackson-Pratt drain in the prevertebral space and tunneled it out through a separate stab wound.  We then reapproximated patient's platysmal muscle with interrupted 3-0 Vicryl suture. We then reapproximated the subcutaneous tissue with interrupted 3-0 Vicryl suture. The skin was reapproximated with Steri-Strips and benzoin. The wound was then covered with bacitracin ointment. A sterile dressing was applied. The drapes were removed. Patient was subsequently extubated by the anesthesia team and transported to the post anesthesia care unit in stable condition. All sponge instrument and needle counts were reportedly correct at the end of this case.

## 2020-11-09 NOTE — H&P (Signed)
Subjective: The patient is a 55 year old white male who has complained of neck and right greater left arm pain consistent with a cervical radiculopathy.  He has failed medical management and was worked up with a cervical MRI which demonstrated multilevel spondylosis and stenosis.  I discussed the various treatment options with him.  He has decided proceed with surgery.  Past Medical History:  Diagnosis Date   Allergy    Anxiety    Asthma    Chronic kidney disease    stones   Chronic radicular cervical pain 08/04/2018   Depression    Diabetes mellitus without complication ([place])    type 2   Essential hypertension 01/02/2017   GERD (gastroesophageal reflux disease)    History of kidney stones    HLD (hyperlipidemia) 01/02/2017   Sleep apnea    no CPAP    Past Surgical History:  Procedure Laterality Date   CARDIAC CATHETERIZATION N/A 11/29/2015   Procedure: Left Heart Cath and Coronary Angiography;  Surgeon: Belva Crome, MD;  Location: [facility];  Service: Cardiovascular;  Laterality: N/A;   COLONOSCOPY  2017   STONE EXTRACTION WITH BASKET     TESTICLE SURGERY      Allergies  Allergen Reactions   Strawberry Flavor Anaphylaxis    Anything with the fruit   Ozempic (0.25 Or 0.5 Mg-Dose) [Semaglutide(0.25 Or 0.5mg -Dos)]     cramping   Hydrocodone Other (See Comments)    Mood swings    Niacin And Related Other (See Comments)    Feel like skin burning    Omeprazole Other (See Comments)    Abdominal pain    Social History   Tobacco Use   Smoking status: Never   Smokeless tobacco: Never  Substance Use Topics   Alcohol use: No    Family History  Problem Relation Age of Onset   COPD Mother    Diabetes Mother    Hyperlipidemia Mother    Hypertension Father    Colon cancer Maternal Uncle 54   Heart disease Sister    Prior to Admission medications   Medication Sig Start Date End Date Taking? Authorizing Provider  atorvastatin (LIPITOR) 20 MG tablet Take 20 mg by  mouth daily.   Yes [provider]  baclofen (LIORESAL) 10 MG tablet Take 1 tablet (10 mg total) by mouth 3 (three) times daily. Patient taking differently: Take 10 mg by mouth 2 (two) times daily. 08/16/20  Yes Dettinger, Fransisca Kaufmann, MD  dicyclomine (BENTYL) 20 MG tablet One tablet po every 6 hours prn for abdominal pain Patient taking differently: Take 20 mg by mouth every 6 (six) hours as needed for spasms. One tablet po every 6 hours prn for abdominal pain 03/13/20  Yes Dettinger, Fransisca Kaufmann, MD  fluticasone (FLONASE) 50 MCG/ACT nasal spray Place 1 spray into both nostrils daily as needed for allergies or rhinitis.   Yes [provider]  lisinopril (ZESTRIL) 5 MG tablet Take 1 tablet (5 mg total) by mouth daily. 08/08/20  Yes Dettinger, Fransisca Kaufmann, MD  meloxicam (MOBIC) 15 MG tablet Take 1 tablet (15 mg total) by mouth daily. 08/03/20  Yes Dettinger, Fransisca Kaufmann, MD  metFORMIN (GLUCOPHAGE-XR) 500 MG 24 hr tablet Take 2 tablets (1,000 mg total) by mouth 2 (two) times daily. 06/14/20  Yes Dettinger, Fransisca Kaufmann, MD  atorvastatin (LIPITOR) 40 MG tablet Take 1 tablet (40 mg total) by mouth daily. Patient not taking: No sig reported 03/13/20   Dettinger, Fransisca Kaufmann, MD  Continuous  Blood Gluc Receiver ([place]) DEVI 1 each by Does not apply route 4 (four) times daily. 08/14/20   Dettinger, Fransisca Kaufmann, MD  Continuous Blood Gluc Sensor (DEXCOM G6 SENSOR) MISC 1 each by Does not apply route once a week. 08/14/20   Dettinger, Fransisca Kaufmann, MD  Continuous Blood Gluc Transmit (DEXCOM G6 TRANSMITTER) MISC 1 each by Does not apply route 4 (four) times daily. 08/14/20   Dettinger, Fransisca Kaufmann, MD  glucose blood (ONETOUCH VERIO) test strip Test BS up to 6 times daily Dx E11.9 05/16/20   Dettinger, Fransisca Kaufmann, MD  Lancets Glory Rosebush ULTRASOFT) lancets Use as instructed 08/08/20   Dettinger, Fransisca Kaufmann, MD     Review of Systems  Positive ROS: As above  All other systems have been reviewed and were otherwise negative  with the exception of those mentioned in the HPI and as above.  Objective: Vital signs in last 24 hours: Temp:  [97.6 F (36.4 C)] 97.6 F (36.4 C) (07/21 0545) Pulse Rate:  [67] 67 (07/21 0545) Resp:  [18] 18 (07/21 0545) BP: (125)/(81) 125/81 (07/21 0545) SpO2:  [98 %] 98 % (07/21 0545) Weight:  [101.3 kg-101.8 kg] 101.3 kg (07/21 0545) Estimated body mass index is 31.16 kg/m as calculated from the following:   Height as of this encounter: 5\' 11"  (1.803 m).   Weight as of this encounter: 101.3 kg.   General Appearance: Alert Head: Normocephalic, without obvious abnormality, atraumatic Eyes: PERRL, conjunctiva/corneas clear, EOM's intact,    Ears: Normal  Throat: Normal  Neck: Limited cervical range of motion.  Positive Spurling's testing on the right Back: unremarkable Lungs: Clear to auscultation bilaterally, respirations unlabored Heart: Regular rate and rhythm, no murmur, rub or gallop Abdomen: Soft, non-tender Extremities: Extremities normal, atraumatic, no cyanosis or edema Skin: unremarkable  NEUROLOGIC:   Mental status: alert and oriented,Motor Exam - grossly normal Sensory Exam - grossly normal Reflexes:  Coordination - grossly normal Gait - grossly normal Balance - grossly normal Cranial Nerves: I: smell Not tested  II: visual acuity  OS: Normal  OD: Normal   II: visual fields Full to confrontation  II: pupils Equal, round, reactive to light  III,VII: ptosis None  III,IV,VI: extraocular muscles  Full ROM  V: mastication Normal  V: facial light touch sensation  Normal  V,VII: corneal reflex  Present  VII: facial muscle function - upper  Normal  VII: facial muscle function - lower Normal  VIII: hearing Not tested  IX: soft palate elevation  Normal  IX,X: gag reflex Present  XI: trapezius strength  5/5  XI: sternocleidomastoid strength 5/5  XI: neck flexion strength  5/5  XII: tongue strength  Normal    Data Review Lab Results  Component Value  Date   WBC 6.6 10/24/2020   HGB 14.8 10/24/2020   HCT 44.3 10/24/2020   MCV 85.4 10/24/2020   PLT 222 10/24/2020   Lab Results  Component Value Date   NA 134 (L) 10/24/2020   K 4.8 10/24/2020   CL 102 10/24/2020   CO2 23 10/24/2020   BUN 19 10/24/2020   CREATININE 1.02 10/24/2020   GLUCOSE 169 (H) 10/24/2020   Lab Results  Component Value Date   INR 1.0 11/27/2015    Assessment/Plan: C3-4, C4-5, C5-6 and C6-7 spondylosis, stenosis, cervical radiculopathy, cervicalgia: I have discussed the situation with the patient.  I reviewed his MRI scan with him and pointed out the abnormalities.  We have discussed the various treatment options including  surgery.  I have described the surgical treatment option of a C3-4, C4-5, C5-6 and C6-7 anterior cervicectomy, fusion and plating.  I have shown him surgical models.  I have given him a surgical pamphlet.  We have discussed the risk, benefits, alternatives, expected postoperative course, and likelihood of achieving our goals with surgery.  I have answered all his questions.  He has decided proceed with surgery.   Ophelia Charter 11/09/2020 7:40 AM

## 2020-11-09 NOTE — Progress Notes (Signed)
Subjective: The patient is somnolent but easily arousable.  He is in no apparent distress.  Objective: Vital signs in last 24 hours: Temp:  [97.6 F (36.4 C)] 97.6 F (36.4 C) (07/21 0545) Pulse Rate:  [67-101] 101 (07/21 1230) Resp:  [17-18] 17 (07/21 1230) BP: (125)/(81) 125/81 (07/21 0545) SpO2:  [98 %-99 %] 99 % (07/21 1230) Weight:  [101.3 kg] 101.3 kg (07/21 0545) Estimated body mass index is 31.16 kg/m as calculated from the following:   Height as of this encounter: 5\' 11"  (1.803 m).   Weight as of this encounter: 101.3 kg.   Intake/Output from previous day: No intake/output data recorded. Intake/Output this shift: Total I/O In: 1700 [I.V.:1700] Out: 750 [Urine:600; Blood:150]  Physical exam the patient is somnolent but arousable.  He is moving all 4 extremities well.  His dressing is clean and dry without hematoma or shift.  Lab Results: No results for input(s): WBC, HGB, HCT, PLT in the last 72 hours. BMET No results for input(s): NA, K, CL, CO2, GLUCOSE, BUN, CREATININE, CALCIUM in the last 72 hours.  Studies/Results: No results found.  Assessment/Plan: The patient is doing well.  I spoke with his wife.  LOS: 0 days     Ophelia Charter 11/09/2020, 12:45 PM

## 2020-11-10 ENCOUNTER — Encounter (HOSPITAL_COMMUNITY): Payer: Self-pay | Admitting: Neurosurgery

## 2020-11-10 LAB — GLUCOSE, CAPILLARY: Glucose-Capillary: 220 mg/dL — ABNORMAL HIGH (ref 70–99)

## 2020-11-10 MED ORDER — DOCUSATE SODIUM 100 MG PO CAPS: 100.0000 mg | ORAL_CAPSULE | Freq: Two times a day (BID) | ORAL | 0 refills | Status: DC

## 2020-11-10 MED ORDER — OXYCODONE-ACETAMINOPHEN 5-325 MG PO TABS
1.0000 | ORAL_TABLET | ORAL | Status: DC | PRN
Start: 2020-11-10 — End: 2020-11-10

## 2020-11-10 MED ORDER — OXYCODONE-ACETAMINOPHEN 5-325 MG PO TABS: 1.0000 | ORAL_TABLET | ORAL | 0 refills | Status: DC | PRN

## 2020-11-10 MED ORDER — CYCLOBENZAPRINE HCL 10 MG PO TABS: 10.0000 mg | ORAL_TABLET | Freq: Three times a day (TID) | ORAL | 0 refills | Status: DC | PRN

## 2020-11-10 MED FILL — Thrombin For Soln 5000 Unit: CUTANEOUS | Qty: 5000 | Status: AC

## 2020-11-10 NOTE — Progress Notes (Signed)
Patient was transported via wheelchair by volunteer for discharge home; in no acute distress nor complaints of pain nor discomfort; room was checked and all belongings accounted for; discharge instructions was given to patient and his wife by assigned RN and both verbalized understanding on the instructions given.

## 2020-11-10 NOTE — Evaluation (Signed)
Occupational Therapy Evaluation/Discharge  Patient Details Name: Adrian Jacobson MRN: [id number] DOB: 01/10/1966 Today's Date: 11/10/2020    History of Present Illness 55 y.o. male s/p C3-7 ACDF and plating. PMH significant for CKD, depression, DM II, HTN, HLD, sleep apnea, and cardiac catherization.   Clinical Impression   PTA, pt was independent and was living with wife and daughter. Pt performing UB and LB ADLs with mod I for increased time. Pt educated and demonstrating use of compensatory techniques for LB dressing, UB dressing, tub/shower transfer, toileting, bed mobility, brace application, and oral care. All questions answered. Recommend discharge home with no follow up OT. Re-consult if change in status. OT to sign off.     Follow Up Recommendations  No OT follow up    Equipment Recommendations  None recommended by OT    Recommendations for Other Services       Precautions / Restrictions Precautions Precautions: Cervical Precaution Booklet Issued: Yes (comment) Precaution Comments: All education reviewed. Required Braces or Orthoses: Cervical Brace Cervical Brace: Hard collar Restrictions Weight Bearing Restrictions: No      Mobility Bed Mobility Overal bed mobility: Modified Independent             General bed mobility comments: Pt educated and demonstrating use of log roll technique with mod I.    Transfers Overall transfer level: Modified independent Equipment used: None             General transfer comment: Pt performing transfers with mod I for increased time.    Balance Overall balance assessment: Modified Independent                                         ADL either performed or assessed with clinical judgement   ADL Overall ADL's : Modified independent                                       General ADL Comments: Pt performing all ADLs with mod I. Pt educated and demonstrating use of compensatory  techniques for LB dressing, UB dressing, tub/shower transfer, toileting, and oral care.     Vision   Vision Assessment?: No apparent visual deficits     Perception Perception Perception Tested?: No Comments: no apparent difficulty with perception   Praxis Praxis Praxis tested?: Not tested Praxis-Other Comments: no apparent difficulty with motor planning    Pertinent Vitals/Pain Pain Assessment: No/denies pain     Hand Dominance     Extremity/Trunk Assessment Upper Extremity Assessment Upper Extremity Assessment: Overall WFL for tasks assessed   Lower Extremity Assessment Lower Extremity Assessment: Defer to PT evaluation   Cervical / Trunk Assessment Cervical / Trunk Assessment: Other exceptions Cervical / Trunk Exceptions: s/p cervical spinal surgery   Communication Communication Communication: No difficulties   Cognition Arousal/Alertness: Awake/alert Behavior During Therapy: WFL for tasks assessed/performed Overall Cognitive Status: Within Functional Limits for tasks assessed                                 General Comments: Pt maintaining precautions after education.   General Comments  Pt wife and daughter present. All questions answered.    Exercises     Shoulder Instructions      Home Living Family/patient  expects to be discharged to:: Private residence Living Arrangements: Spouse/significant other;Children Available Help at Discharge: Family Type of Home: House Home Access: [place]: Two level;Able to live on main level with bedroom/bathroom Alternate Level Stairs-Number of Steps: flight   Bathroom Shower/Tub: Tub/shower unit         Home Equipment: None   Additional Comments: Pt reporting he feels safe to discharge home. Wife and daughter available to assist.      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         OT Problem List: Decreased strength;Decreased knowledge of precautions;Pain      OT Treatment/Interventions:      OT Goals(Current goals can be found in the care plan section) Acute Rehab OT Goals Patient Stated Goal: go home OT Goal Formulation: With patient/family  OT Frequency:     Barriers to D/C:            Co-evaluation              AM-PAC OT "6 Clicks" Daily Activity     Outcome Measure Help from another person eating meals?: None Help from another person taking care of personal grooming?: None Help from another person toileting, which includes using toliet, bedpan, or urinal?: None Help from another person bathing (including washing, rinsing, drying)?: None Help from another person to put on and taking off regular upper body clothing?: None Help from another person to put on and taking off regular lower body clothing?: None 6 Click Score: 24   End of Session Equipment Utilized During Treatment: Cervical collar Nurse Communication: Mobility status  Activity Tolerance: Patient tolerated treatment well Patient left: in bed;with call bell/phone within reach;with family/visitor present  OT Visit Diagnosis: Unsteadiness on feet (R26.81);Muscle weakness (generalized) (M62.81);Pain Pain - part of body:  (surgical site)                Time: [id number] OT Time Calculation (min): 15 min Charges:  OT General Charges $OT Visit: 1 Visit OT Evaluation $OT Eval Low Complexity: 1 Low  Shanda Howells, OTDS   Shanda Howells 11/10/2020, 8:06 AM

## 2020-11-10 NOTE — Discharge Summary (Signed)
Physician Discharge Summary  [name] MRN: [id number] DOB/AGE: [birth year] 55 y.o.  Admit date: 11/09/2020 Discharge date: 11/10/2020  Admission Diagnoses: Cervical spondylosis with myelopathy and radiculopathy, cervicalgia, cervical spinal stenosis  Discharge Diagnoses: The same Active Problems:   Cervical spondylosis with myelopathy and radiculopathy   Discharged Condition: good  Hospital Course: I performed a C3-4, C4-5, C5-6 and C6-7 anterior cervical discectomy, fusion and plating on the patient on 11/09/2020.  The surgery went well.  The patient's postoperative course was unremarkable.  On postoperative day #1 he requested discharge to home.  He was given written and oral discharge instructions.  All his questions were answered.  Consults: PT, OT, care management Significant Diagnostic Studies: None Treatments: C3-4, C4-5, C5-6 and C6-7 anterior cervical discectomy, fusion and plating. Discharge Exam: Blood pressure 116/78, pulse 79, temperature 98 F (36.7 C), temperature source Oral, resp. rate 18, height '5\' 11"'$  (1.803 m), weight 101.3 kg, SpO2 95 %. The patient is alert and pleasant.  He looks well.  His dressing is clean and dry.  There is no hematoma or shift.  His strength is normal.  Disposition: Home  Discharge Instructions     Call MD for:  difficulty breathing, headache or visual disturbances   Complete by: As directed    Call MD for:  extreme fatigue   Complete by: As directed    Call MD for:  hives   Complete by: As directed    Call MD for:  persistant dizziness or light-headedness   Complete by: As directed    Call MD for:  persistant nausea and vomiting   Complete by: As directed    Call MD for:  redness, tenderness, or signs of infection (pain, swelling, redness, odor or green/yellow discharge around incision site)   Complete by: As directed    Call MD for:  severe uncontrolled pain   Complete by: As directed    Call MD for:   temperature >100.4   Complete by: As directed    Diet - low sodium heart healthy   Complete by: As directed    Discharge instructions   Complete by: As directed    Call [phone] for a followup appointment. Take a stool softener while you are using pain medications.   Driving Restrictions   Complete by: As directed    Do not drive for 2 weeks.   Increase activity slowly   Complete by: As directed    Lifting restrictions   Complete by: As directed    Do not lift more than 5 pounds. No excessive bending or twisting.   May shower / Bathe   Complete by: As directed    Remove the dressing for 3 days after surgery.  You may shower, but leave the incision alone.   Remove dressing in 48 hours   Complete by: As directed       Allergies as of 11/10/2020       Reactions   Strawberry Flavor Anaphylaxis   Anything with the fruit   Ozempic (0.25 Or 0.5 Mg-dose) [semaglutide(0.25 Or 0.'5mg'$ -dos)]    cramping   Hydrocodone Other (See Comments)   Mood swings    Niacin And Related Other (See Comments)   Feel like skin burning    Omeprazole Other (See Comments)   Abdominal pain        Medication List     STOP taking these medications    meloxicam 15 MG tablet Commonly known as: MOBIC  TAKE these medications    atorvastatin 20 MG tablet Commonly known as: LIPITOR Take 20 mg by mouth daily. What changed: Another medication with the same name was removed. Continue taking this medication, and follow the directions you see here.   baclofen 10 MG tablet Commonly known as: LIORESAL Take 1 tablet (10 mg total) by mouth 3 (three) times daily. What changed: when to take this   cyclobenzaprine 10 MG tablet Commonly known as: FLEXERIL Take 1 tablet (10 mg total) by mouth 3 (three) times daily as needed for muscle spasms.   Dexcom G6 Receiver Devi 1 each by Does not apply route 4 (four) times daily.   Dexcom G6 Sensor Misc 1 each by Does not apply route once a week.    Dexcom G6 Transmitter Misc 1 each by Does not apply route 4 (four) times daily.   dicyclomine 20 MG tablet Commonly known as: BENTYL One tablet po every 6 hours prn for abdominal pain What changed:  how much to take how to take this when to take this reasons to take this   docusate sodium 100 MG capsule Commonly known as: COLACE Take 1 capsule (100 mg total) by mouth 2 (two) times daily.   fluticasone 50 MCG/ACT nasal spray Commonly known as: FLONASE Place 1 spray into both nostrils daily as needed for allergies or rhinitis.   lisinopril 5 MG tablet Commonly known as: ZESTRIL Take 1 tablet (5 mg total) by mouth daily.   metFORMIN 500 MG 24 hr tablet Commonly known as: GLUCOPHAGE-XR Take 2 tablets (1,000 mg total) by mouth 2 (two) times daily.   onetouch ultrasoft lancets Use as instructed   OneTouch Verio test strip Generic drug: glucose blood Test BS up to 6 times daily Dx E11.9   oxyCODONE-acetaminophen 5-325 MG tablet Commonly known as: PERCOCET/ROXICET Take 1-2 tablets by mouth every 4 (four) hours as needed for moderate pain.         Signed: Ophelia Charter 11/10/2020, 6:49 AM

## 2020-12-09 ENCOUNTER — Encounter: Payer: Self-pay | Admitting: Family Medicine

## 2021-01-12 ENCOUNTER — Ambulatory Visit: Payer: Managed Care, Other (non HMO) | Admitting: Family Medicine

## 2021-05-07 ENCOUNTER — Other Ambulatory Visit: Payer: Self-pay | Admitting: Family Medicine

## 2021-05-07 MED ORDER — ATORVASTATIN CALCIUM 20 MG PO TABS: 20.0000 mg | ORAL_TABLET | Freq: Every day | ORAL | 1 refills | Status: DC

## 2021-05-18 ENCOUNTER — Other Ambulatory Visit: Payer: Self-pay | Admitting: Family Medicine

## 2021-08-09 IMAGING — CR DG CERVICAL SPINE 2 OR 3 VIEWS
2 series · 2 of 2 positions shown · non-contrast
Comparison: 08/12/2018

CLINICAL DATA: Intraoperative localization

EXAM:
CERVICAL SPINE - 2 VIEW

[AP (1 of 2)]
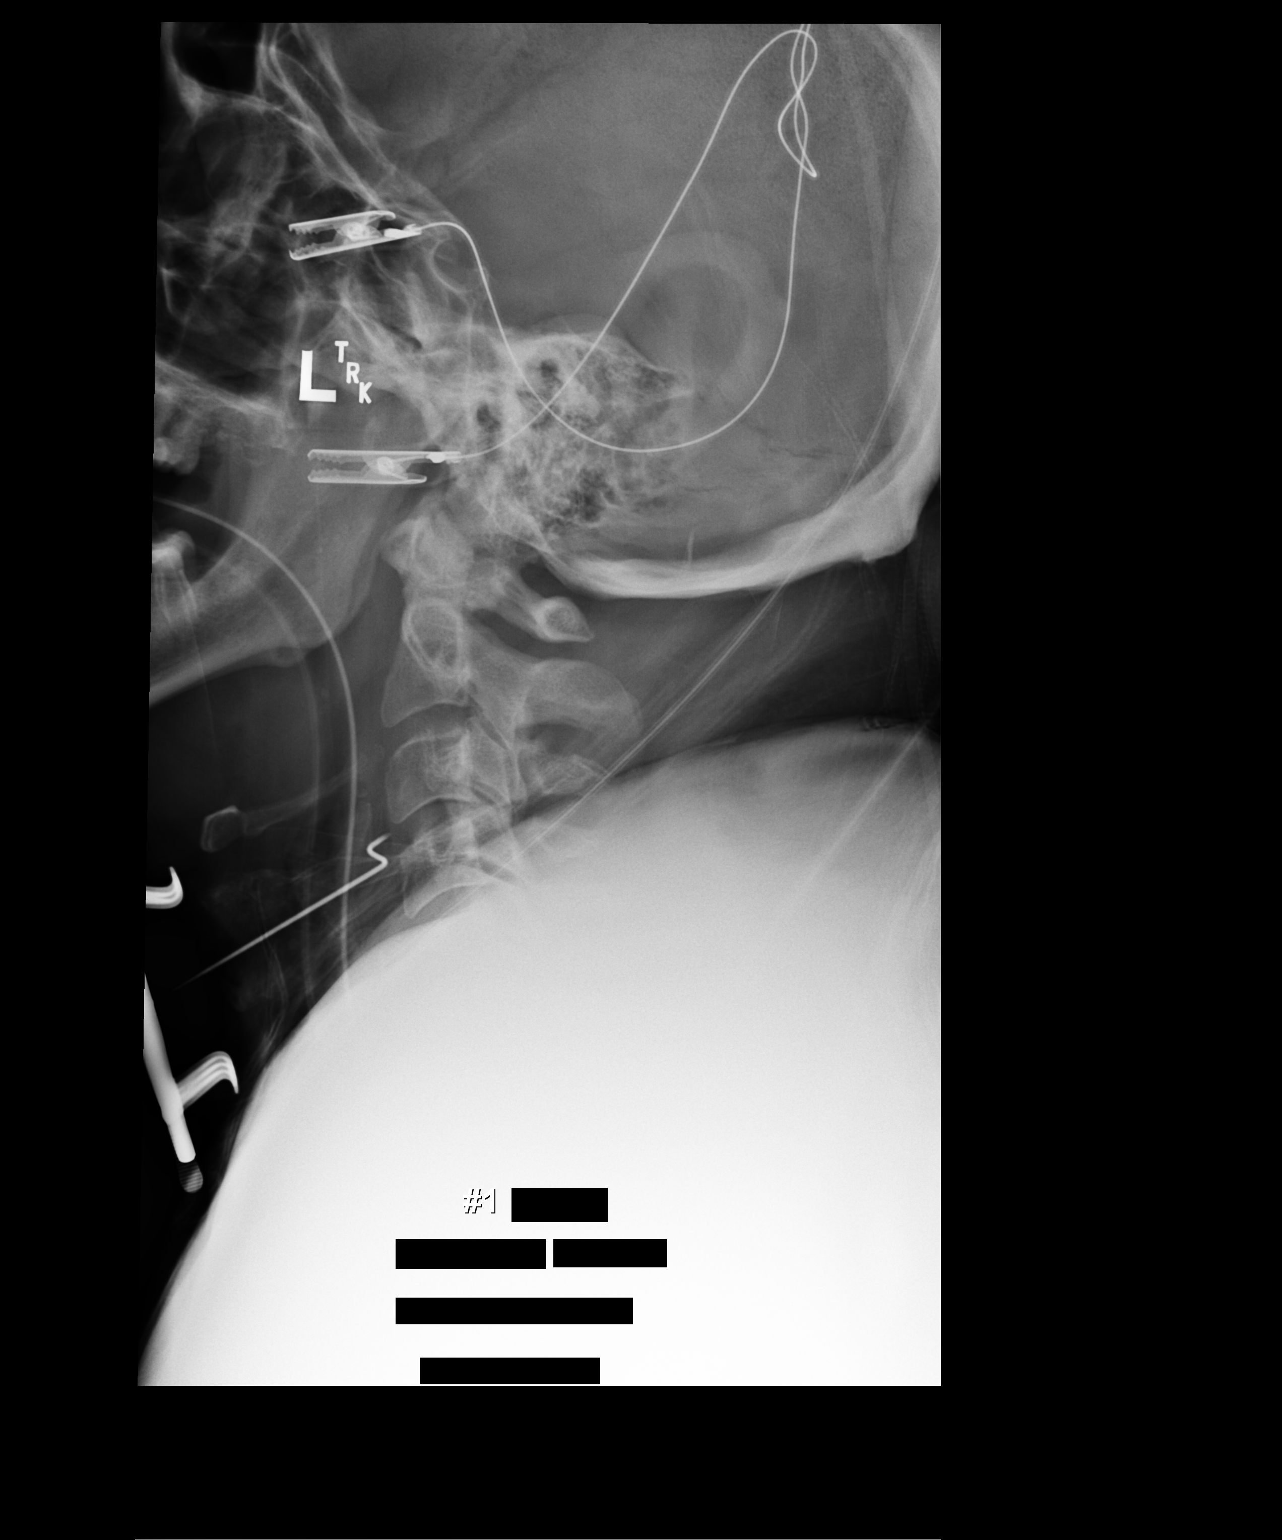

[AP (2 of 2)]
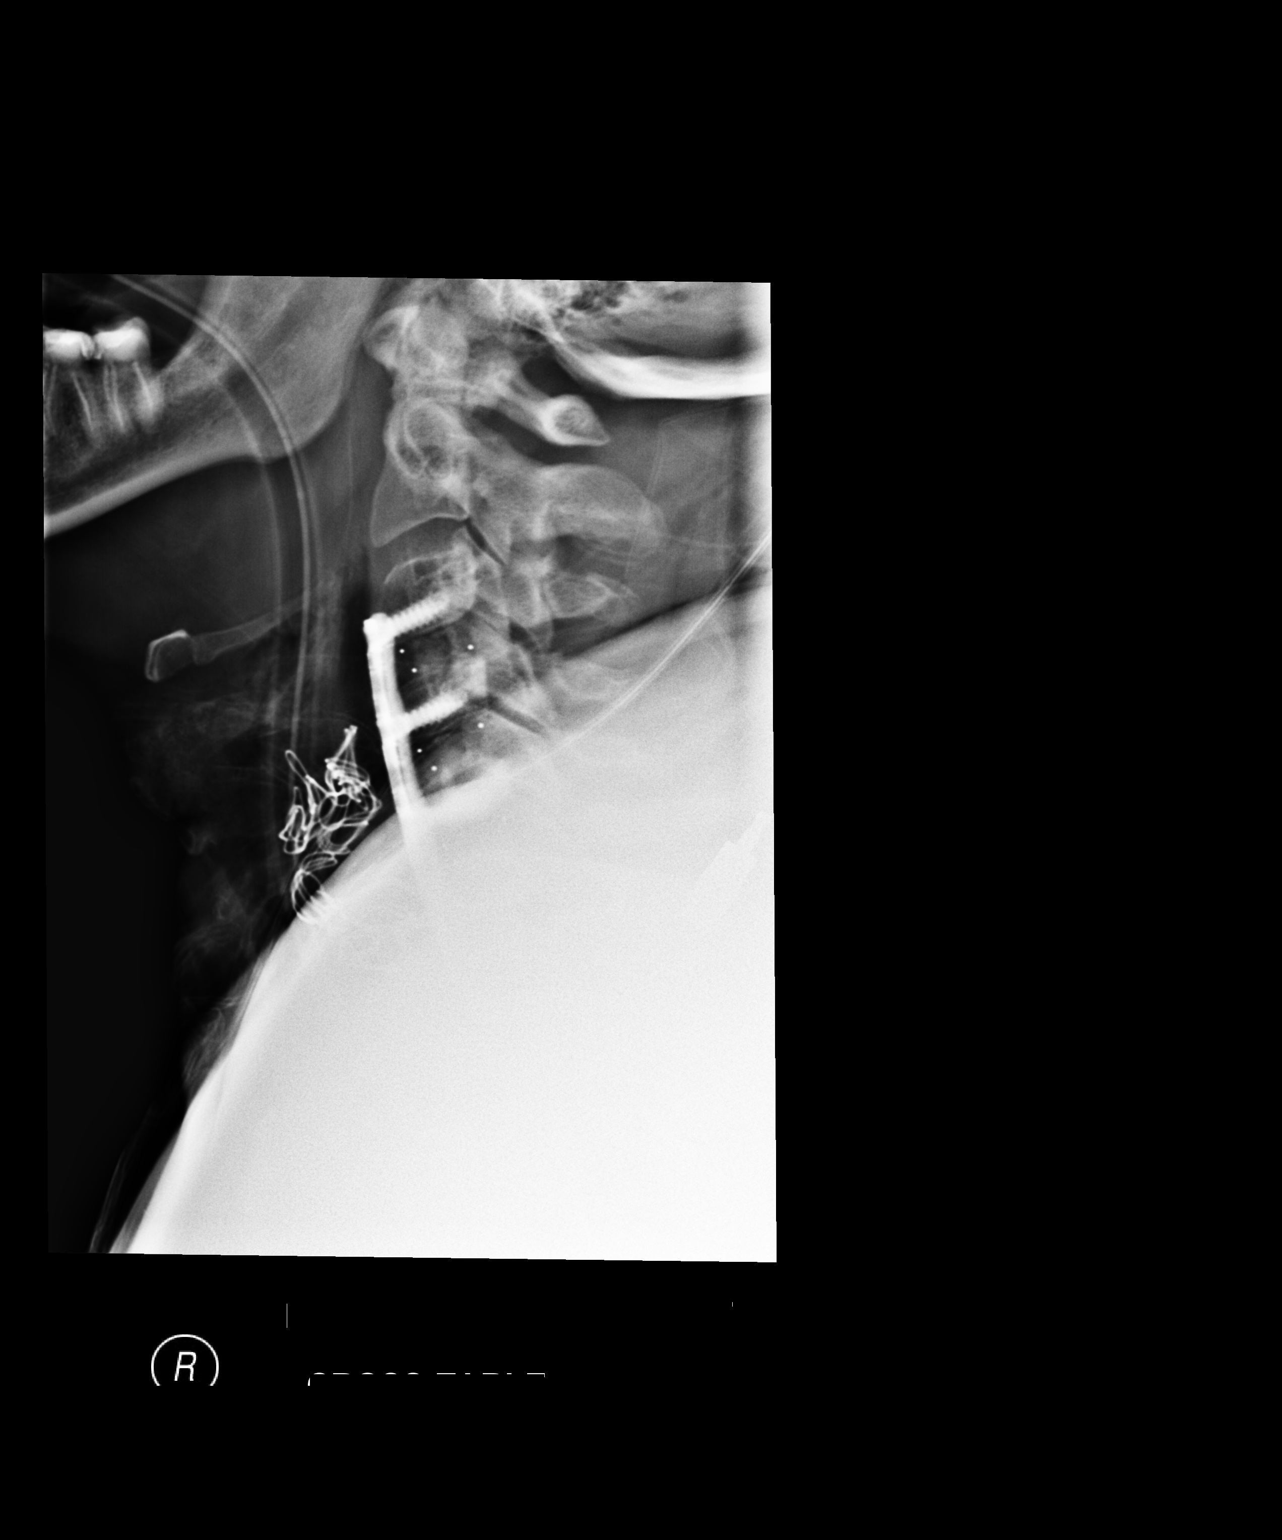

[2 of 2 positions shown; findings below may reference images not displayed]

FINDINGS: Lateral radiographs were obtained intraoperatively. The initial
image demonstrates occurred instrument at the anterior aspect of the
C3-4 interspace. Subsequent lateral film shows evidence of prior
interbody fusion from C3 to C7 with anterior fixation. The lower
elements are somewhat difficult to visualized.
IMPRESSION: Cervical fusion

## 2021-08-12 ENCOUNTER — Other Ambulatory Visit: Payer: Self-pay | Admitting: Family Medicine

## 2021-08-19 ENCOUNTER — Other Ambulatory Visit: Payer: Self-pay | Admitting: Family Medicine

## 2021-08-20 NOTE — Telephone Encounter (Signed)
Looks like meloxicam D/C by hospital. Is it ok to fill baclofen? ?

## 2021-09-12 ENCOUNTER — Telehealth: Payer: Self-pay | Admitting: Family Medicine

## 2021-09-12 MED ORDER — LISINOPRIL 5 MG PO TABS: 5.0000 mg | ORAL_TABLET | Freq: Every day | ORAL | 0 refills | Status: DC

## 2021-09-12 NOTE — Telephone Encounter (Signed)
Pt has an appt scheduled 6/29. Enough refills sent to make it through the appt.

## 2021-09-12 NOTE — Telephone Encounter (Signed)
  Prescription Request  09/12/2021  What is the name of the medication or equipment? LISINOPRIL  Have you contacted your pharmacy to request a refill? YES   Which pharmacy would you like this sent to? [facility] [place]  Pt is scheduled to see PCP on 10/18/21 for CPE but needs refills sent to pharmacy to last him until his appt.

## 2021-10-10 ENCOUNTER — Other Ambulatory Visit: Payer: Self-pay | Admitting: Family Medicine

## 2021-10-18 ENCOUNTER — Encounter: Payer: Self-pay | Admitting: Family Medicine

## 2021-10-18 ENCOUNTER — Ambulatory Visit (INDEPENDENT_AMBULATORY_CARE_PROVIDER_SITE_OTHER): Payer: 59 | Admitting: Family Medicine

## 2021-10-18 VITALS — BP 115/78 | HR 78 | Temp 98.0°F | Ht 71.0 in | Wt 228.0 lb

## 2021-10-18 DIAGNOSIS — Z23 Encounter for immunization: Secondary | ICD-10-CM | POA: Diagnosis not present

## 2021-10-18 DIAGNOSIS — Z0000 Encounter for general adult medical examination without abnormal findings: Secondary | ICD-10-CM

## 2021-10-18 DIAGNOSIS — Z0001 Encounter for general adult medical examination with abnormal findings: Secondary | ICD-10-CM | POA: Diagnosis not present

## 2021-10-18 DIAGNOSIS — I10 Essential (primary) hypertension: Secondary | ICD-10-CM | POA: Diagnosis not present

## 2021-10-18 DIAGNOSIS — E782 Mixed hyperlipidemia: Secondary | ICD-10-CM

## 2021-10-18 DIAGNOSIS — E1169 Type 2 diabetes mellitus with other specified complication: Secondary | ICD-10-CM | POA: Diagnosis not present

## 2021-10-18 LAB — CMP14+EGFR
ALT: 42 IU/L (ref 0–44)
AST: 28 IU/L (ref 0–40)
Albumin/Globulin Ratio: 1.8 (ref 1.2–2.2)
Albumin: 4.6 g/dL (ref 3.8–4.9)
Alkaline Phosphatase: 68 IU/L (ref 44–121)
BUN/Creatinine Ratio: 15 (ref 9–20)
BUN: 17 mg/dL (ref 6–24)
Bilirubin Total: 0.3 mg/dL (ref 0.0–1.2)
CO2: 22 mmol/L (ref 20–29)
Calcium: 9.9 mg/dL (ref 8.7–10.2)
Chloride: 98 mmol/L (ref 96–106)
Creatinine, Ser: 1.15 mg/dL (ref 0.76–1.27)
Globulin, Total: 2.6 g/dL (ref 1.5–4.5)
Glucose: 233 mg/dL — ABNORMAL HIGH (ref 70–99)
Potassium: 5.3 mmol/L — ABNORMAL HIGH (ref 3.5–5.2)
Sodium: 136 mmol/L (ref 134–144)
Total Protein: 7.2 g/dL (ref 6.0–8.5)
eGFR: 76 mL/min/{1.73_m2} (ref >59)

## 2021-10-18 LAB — CBC WITH DIFFERENTIAL/PLATELET
Basophils Absolute: 0.1 10*3/uL (ref 0.0–0.2)
Basos: 1 %
EOS (ABSOLUTE): 0.6 10*3/uL — ABNORMAL HIGH (ref 0.0–0.4)
Eos: 11 %
Hematocrit: 41.9 % (ref 37.5–51.0)
Hemoglobin: 14.3 g/dL (ref 13.0–17.7)
Immature Grans (Abs): 0.1 10*3/uL (ref 0.0–0.1)
Immature Granulocytes: 1 %
Lymphocytes Absolute: 1.7 10*3/uL (ref 0.7–3.1)
Lymphs: 32 %
MCH: 28.4 pg (ref 26.6–33.0)
MCHC: 34.1 g/dL (ref 31.5–35.7)
MCV: 83 fL (ref 79–97)
Monocytes Absolute: 0.5 10*3/uL (ref 0.1–0.9)
Monocytes: 10 %
Neutrophils Absolute: 2.5 10*3/uL (ref 1.4–7.0)
Neutrophils: 45 %
Platelets: 215 10*3/uL (ref 150–450)
RBC: 5.03 x10E6/uL (ref 4.14–5.80)
RDW: 12.3 % (ref 11.6–15.4)
WBC: 5.4 10*3/uL (ref 3.4–10.8)

## 2021-10-18 LAB — LIPID PANEL
Chol/HDL Ratio: 4.6 ratio (ref 0.0–5.0)
Cholesterol, Total: 153 mg/dL (ref 100–199)
HDL: 33 mg/dL — ABNORMAL LOW (ref >39)
LDL Chol Calc (NIH): 82 mg/dL (ref 0–99)
Triglycerides: 229 mg/dL — ABNORMAL HIGH (ref 0–149)
VLDL Cholesterol Cal: 38 mg/dL (ref 5–40)

## 2021-10-18 LAB — BAYER DCA HB A1C WAIVED: HB A1C (BAYER DCA - WAIVED): 8.2 % — ABNORMAL HIGH (ref 4.8–5.6)

## 2021-10-18 MED ORDER — ONETOUCH ULTRASOFT LANCETS MISC: 12 refills | Status: DC

## 2021-10-18 MED ORDER — FREESTYLE LIBRE 3 SENSOR MISC: 1.0000 | 3 refills | Status: DC

## 2021-10-18 MED ORDER — LISINOPRIL 5 MG PO TABS: 5.0000 mg | ORAL_TABLET | Freq: Every day | ORAL | 3 refills | Status: DC

## 2021-10-18 MED ORDER — ATORVASTATIN CALCIUM 20 MG PO TABS: 20.0000 mg | ORAL_TABLET | Freq: Every day | ORAL | 3 refills | Status: DC

## 2021-10-18 MED ORDER — METFORMIN HCL ER 500 MG PO TB24: 1000.0000 mg | ORAL_TABLET | Freq: Two times a day (BID) | ORAL | 3 refills | Status: DC

## 2021-10-18 NOTE — Progress Notes (Signed)
BP 115/78   Pulse 78   Temp 98 F (36.7 C)   Ht _0  (1.803 m)   Wt 228 lb (103.4 kg)   SpO2 98%   BMI 31.80 kg/m    Subjective:   [name], male    DOB: August 18, 1965, 56 y.o.   MRN: [id number]  HPI: Adrian Jacobson is a 56 y.o. male presenting on 10/18/2021 for Medical Management of Chronic Issues and Diabetes   HPI Physical exam Patient denies any chest pain, shortness of breath, headaches or vision issues, abdominal complaints, diarrhea, nausea, vomiting, or joint issues.  Recently had a neck surgery and is recovering from the  Type 2 diabetes mellitus Patient comes in today for recheck of his diabetes. Patient has been currently taking metformin A1c is up after his surgery, he has not been eating as well for. Patient is currently on an ACE inhibitor/ARB. Patient has not seen an ophthalmologist this year. Patient denies any issues with their feet. The symptom started onset as an adult hypertension and hyper the ARE RELATED TO DM   Hypertension Patient is currently on lisinopril, and their blood pressure today is 115/78. Patient denies any lightheadedness or dizziness. Patient denies headaches, blurred vision, chest pains, shortness of breath, or weakness. Denies any side effects from medication and is content with current medication.   Hyperlipidemia Patient is coming in for recheck of his hyperlipidemia. The patient is currently taking atorvastatin. They deny any issues with myalgias or history of liver damage from it. They deny any focal numbness or weakness or chest pain.   Relevant past medical, surgical, family and social history reviewed and updated as indicated. Interim medical history since our last visit reviewed. Allergies and medications reviewed and updated.  Review of Systems  Constitutional:  Negative for chills and fever.  HENT:  Negative for ear pain and tinnitus.   Eyes:  Negative for pain and discharge.  Respiratory:  Negative for  cough, shortness of breath and wheezing.   Cardiovascular:  Negative for chest pain, palpitations and leg swelling.  Gastrointestinal:  Negative for abdominal pain, blood in stool, constipation and diarrhea.  Genitourinary:  Negative for dysuria and hematuria.  Musculoskeletal:  Negative for back pain, gait problem and myalgias.  Skin:  Negative for rash.  Neurological:  Negative for dizziness, weakness and headaches.  Psychiatric/Behavioral:  Negative for suicidal ideas.   All other systems reviewed and are negative.   Per HPI unless specifically indicated above   Allergies as of 10/18/2021       Reactions   Strawberry Flavor Anaphylaxis   Anything with the fruit   Ozempic (0.25 Or 0.5 Mg-dose) [semaglutide(0.25 Or 0.75m-dos)]    cramping   Hydrocodone Other (See Comments)   Mood swings    Niacin And Related Other (See Comments)   Feel like skin burning    Omeprazole Other (See Comments)   Abdominal pain        Medication List        Accurate as of October 18, 2021 11:48 AM. If you have any questions, ask your nurse or doctor.          STOP taking these medications    Dexcom G6 Receiver Devi Stopped by: JWorthy Rancher MD   Dexcom G6 Transmitter Misc Stopped by: JFransisca KaufmannDettinger, MD   docusate sodium 100 MG capsule Commonly known as: COLACE Stopped by: JWorthy Rancher MD   oxyCODONE-acetaminophen 5-325 MG tablet Commonly known as:  PERCOCET/ROXICET Stopped by: Fransisca Kaufmann Jaylie Neaves, MD       TAKE these medications    atorvastatin 20 MG tablet Commonly known as: LIPITOR Take 1 tablet (20 mg total) by mouth daily.   baclofen 10 MG tablet Commonly known as: LIORESAL TAKE 1 TABLET BY MOUTH THREE TIMES A DAY   dicyclomine 20 MG tablet Commonly known as: BENTYL One tablet po every 6 hours prn for abdominal pain   fluticasone 50 MCG/ACT nasal spray Commonly known as: FLONASE Place 1 spray into both nostrils daily as needed for allergies or  rhinitis.   FreeStyle Libre 3 Sensor Misc 1 each by Does not apply route every 14 (fourteen) days. Place 1 sensor on the skin every 14 days. Use to check glucose continuously What changed:  when to take this additional instructions Changed by: Fransisca Kaufmann Casee Knepp, MD   lisinopril 5 MG tablet Commonly known as: ZESTRIL Take 1 tablet (5 mg total) by mouth daily.   metFORMIN 500 MG 24 hr tablet Commonly known as: GLUCOPHAGE-XR Take 2 tablets (1,000 mg total) by mouth 2 (two) times daily. What changed: additional instructions Changed by: Worthy Rancher, MD   onetouch ultrasoft lancets Use as instructed   OneTouch Verio test strip Generic drug: glucose blood Test BS up to 6 times daily Dx E11.9         Objective:   BP 115/78   Pulse 78   Temp 98 F (36.7 C)   Ht 5' 11" (1.803 m)   Wt 228 lb (103.4 kg)   SpO2 98%   BMI 31.80 kg/m   Wt Readings from Last 3 Encounters:  10/18/21 228 lb (103.4 kg)  11/09/20 223 lb 6.4 oz (101.3 kg)  10/24/20 231 lb 11.2 oz (105.1 kg)    Physical Exam Vitals reviewed.  Constitutional:      General: He is not in acute distress.    Appearance: He is well-developed. He is not diaphoretic.  HENT:     Right Ear: External ear normal.     Left Ear: External ear normal.     Nose: Nose normal.     Mouth/Throat:     Pharynx: No oropharyngeal exudate.  Eyes:     General: No scleral icterus.    Conjunctiva/sclera: Conjunctivae normal.  Neck:     Thyroid: No thyromegaly.  Cardiovascular:     Rate and Rhythm: Normal rate and regular rhythm.     Heart sounds: Normal heart sounds. No murmur heard. Pulmonary:     Effort: Pulmonary effort is normal. No respiratory distress.     Breath sounds: Normal breath sounds. No wheezing.  Abdominal:     General: Bowel sounds are normal. There is no distension.     Palpations: Abdomen is soft.     Tenderness: There is no abdominal tenderness. There is no guarding or rebound.  Musculoskeletal:         General: No swelling. Normal range of motion.     Cervical back: Neck supple.  Lymphadenopathy:     Cervical: No cervical adenopathy.  Skin:    General: Skin is warm and dry.     Findings: No rash.  Neurological:     Mental Status: He is alert and oriented to person, place, and time.     Coordination: Coordination normal.  Psychiatric:        Behavior: Behavior normal.       Assessment & Plan:   Problem List Items Addressed This Visit  Cardiovascular and Mediastinum   Essential hypertension   Relevant Medications   lisinopril (ZESTRIL) 5 MG tablet   atorvastatin (LIPITOR) 20 MG tablet   Other Relevant Orders   CBC with Differential/Platelet   CMP14+EGFR   Lipid panel   Bayer DCA Hb A1c Waived (Completed)     Endocrine   Type 2 diabetes mellitus (HCC)   Relevant Medications   lisinopril (ZESTRIL) 5 MG tablet   metFORMIN (GLUCOPHAGE-XR) 500 MG 24 hr tablet   atorvastatin (LIPITOR) 20 MG tablet   Continuous Blood Gluc Sensor (FREESTYLE LIBRE 3 SENSOR) MISC   Other Relevant Orders   CBC with Differential/Platelet   CMP14+EGFR   Lipid panel   Bayer DCA Hb A1c Waived (Completed)     Other   HLD (hyperlipidemia)   Relevant Medications   lisinopril (ZESTRIL) 5 MG tablet   atorvastatin (LIPITOR) 20 MG tablet   Other Relevant Orders   CBC with Differential/Platelet   CMP14+EGFR   Lipid panel   Bayer DCA Hb A1c Waived (Completed)   Other Visit Diagnoses     Physical exam    -  Primary   Need for shingles vaccine       Relevant Orders   Varicella-zoster vaccine IM (Shingrix)     Patient wants to try to focus on diet, he wants the freestyle libre so he can get better control and keeping closer track on everything.  We discussed adding medication if he does not get down.  A1c is 8.2 today  Follow up plan: Return in about 3 months (around 01/18/2022), or if symptoms worsen or fail to improve, for Diabetes and hld.  Counseling provided for all of the  vaccine components Orders Placed This Encounter  Procedures   Varicella-zoster vaccine IM (Shingrix)   CBC with Differential/Platelet   CMP14+EGFR   Lipid panel   Bayer DCA Hb A1c Waived    Caryl Pina, MD [facility] 10/18/2021, 11:48 AM

## 2021-11-01 ENCOUNTER — Other Ambulatory Visit: Payer: Self-pay | Admitting: Family Medicine

## 2021-11-24 ENCOUNTER — Other Ambulatory Visit: Payer: Self-pay | Admitting: Family Medicine

## 2021-12-02 ENCOUNTER — Other Ambulatory Visit: Payer: Self-pay | Admitting: Family Medicine

## 2022-01-18 ENCOUNTER — Ambulatory Visit: Payer: 59 | Admitting: Family Medicine

## 2022-01-19 ENCOUNTER — Other Ambulatory Visit: Payer: Self-pay | Admitting: Family Medicine

## 2022-01-21 NOTE — Telephone Encounter (Signed)
Last office visit 10/18/21  Last refill 08/22/21, 60, 2 refills

## 2022-03-06 ENCOUNTER — Ambulatory Visit: Payer: 59 | Admitting: Family Medicine

## 2022-04-13 ENCOUNTER — Other Ambulatory Visit: Payer: Self-pay | Admitting: Family Medicine

## 2022-05-08 ENCOUNTER — Ambulatory Visit: Payer: 59 | Admitting: Family Medicine

## 2022-06-05 ENCOUNTER — Ambulatory Visit: Payer: 59 | Admitting: Family Medicine

## 2022-07-08 ENCOUNTER — Ambulatory Visit: Payer: 59 | Admitting: Family Medicine

## 2022-07-13 ENCOUNTER — Other Ambulatory Visit: Payer: Self-pay | Admitting: Family Medicine

## 2022-07-29 ENCOUNTER — Other Ambulatory Visit: Payer: Self-pay | Admitting: Family Medicine

## 2022-08-14 ENCOUNTER — Ambulatory Visit: Payer: 59 | Admitting: Family Medicine

## 2022-10-18 ENCOUNTER — Ambulatory Visit: Payer: 59 | Admitting: Family Medicine

## 2022-11-11 ENCOUNTER — Ambulatory Visit: Payer: 59 | Admitting: Family Medicine

## 2022-11-20 ENCOUNTER — Ambulatory Visit (INDEPENDENT_AMBULATORY_CARE_PROVIDER_SITE_OTHER): Payer: 59 | Admitting: Family Medicine

## 2022-11-20 ENCOUNTER — Encounter: Payer: Self-pay | Admitting: Family Medicine

## 2022-11-20 VITALS — BP 124/83 | HR 72 | Ht 71.0 in | Wt 230.0 lb

## 2022-11-20 DIAGNOSIS — I10 Essential (primary) hypertension: Secondary | ICD-10-CM | POA: Diagnosis not present

## 2022-11-20 DIAGNOSIS — E1169 Type 2 diabetes mellitus with other specified complication: Secondary | ICD-10-CM

## 2022-11-20 DIAGNOSIS — E782 Mixed hyperlipidemia: Secondary | ICD-10-CM | POA: Diagnosis not present

## 2022-11-20 DIAGNOSIS — Z23 Encounter for immunization: Secondary | ICD-10-CM

## 2022-11-20 LAB — CBC WITH DIFFERENTIAL/PLATELET
Basophils Absolute: 0 10*3/uL (ref 0.0–0.2)
Basos: 1 %
EOS (ABSOLUTE): 0.5 10*3/uL — ABNORMAL HIGH (ref 0.0–0.4)
Eos: 10 %
Hematocrit: 42.7 % (ref 37.5–51.0)
Hemoglobin: 14.4 g/dL (ref 13.0–17.7)
Immature Grans (Abs): 0 10*3/uL (ref 0.0–0.1)
Immature Granulocytes: 0 %
Lymphocytes Absolute: 1.6 10*3/uL (ref 0.7–3.1)
Lymphs: 33 %
MCH: 28.9 pg (ref 26.6–33.0)
MCHC: 33.7 g/dL (ref 31.5–35.7)
MCV: 86 fL (ref 79–97)
Monocytes Absolute: 0.4 10*3/uL (ref 0.1–0.9)
Monocytes: 9 %
Neutrophils Absolute: 2.3 10*3/uL (ref 1.4–7.0)
Neutrophils: 47 %
Platelets: 206 10*3/uL (ref 150–450)
RBC: 4.99 x10E6/uL (ref 4.14–5.80)
RDW: 12.9 % (ref 11.6–15.4)
WBC: 4.9 10*3/uL (ref 3.4–10.8)

## 2022-11-20 LAB — CMP14+EGFR
ALT: 45 IU/L — ABNORMAL HIGH (ref 0–44)
AST: 31 IU/L (ref 0–40)
Albumin: 4.3 g/dL (ref 3.8–4.9)
Alkaline Phosphatase: 65 IU/L (ref 44–121)
BUN/Creatinine Ratio: 15 (ref 9–20)
BUN: 16 mg/dL (ref 6–24)
Bilirubin Total: 0.3 mg/dL (ref 0.0–1.2)
CO2: 20 mmol/L (ref 20–29)
Calcium: 9.8 mg/dL (ref 8.7–10.2)
Chloride: 101 mmol/L (ref 96–106)
Creatinine, Ser: 1.04 mg/dL (ref 0.76–1.27)
Globulin, Total: 2.9 g/dL (ref 1.5–4.5)
Glucose: 202 mg/dL — ABNORMAL HIGH (ref 70–99)
Potassium: 4.8 mmol/L (ref 3.5–5.2)
Sodium: 136 mmol/L (ref 134–144)
Total Protein: 7.2 g/dL (ref 6.0–8.5)
eGFR: 85 mL/min/{1.73_m2} (ref >59)

## 2022-11-20 LAB — LIPID PANEL
Chol/HDL Ratio: 5.3 ratio — ABNORMAL HIGH (ref 0.0–5.0)
Cholesterol, Total: 185 mg/dL (ref 100–199)
HDL: 35 mg/dL — ABNORMAL LOW (ref >39)
LDL Chol Calc (NIH): 103 mg/dL — ABNORMAL HIGH (ref 0–99)
Triglycerides: 275 mg/dL — ABNORMAL HIGH (ref 0–149)
VLDL Cholesterol Cal: 47 mg/dL — ABNORMAL HIGH (ref 5–40)

## 2022-11-20 LAB — BAYER DCA HB A1C WAIVED: HB A1C (BAYER DCA - WAIVED): 10.5 % — ABNORMAL HIGH (ref 4.8–5.6)

## 2022-11-20 MED ORDER — EMPAGLIFLOZIN 25 MG PO TABS: 25.0000 mg | ORAL_TABLET | Freq: Every day | ORAL | 1 refills | Status: DC

## 2022-11-20 MED ORDER — METFORMIN HCL ER 500 MG PO TB24
1000.0000 mg | ORAL_TABLET | Freq: Two times a day (BID) | ORAL | 1 refills | Status: DC
Start: 2022-11-20 — End: 2023-04-27

## 2022-11-20 MED ORDER — ONETOUCH ULTRASOFT LANCETS MISC
12 refills | Status: AC
Start: 2022-11-20 — End: ?

## 2022-11-20 MED ORDER — LISINOPRIL 5 MG PO TABS
5.0000 mg | ORAL_TABLET | Freq: Every day | ORAL | 1 refills | Status: DC
Start: 2022-11-20 — End: 2023-06-30

## 2022-11-20 MED ORDER — DAPAGLIFLOZIN PROPANEDIOL 10 MG PO TABS: 10.0000 mg | ORAL_TABLET | Freq: Every day | ORAL | 1 refills | Status: DC

## 2022-11-20 MED ORDER — ONETOUCH VERIO VI STRP
ORAL_STRIP | 12 refills | Status: AC
Start: 2022-11-20 — End: ?

## 2022-11-20 MED ORDER — ATORVASTATIN CALCIUM 20 MG PO TABS
20.0000 mg | ORAL_TABLET | Freq: Every day | ORAL | 3 refills | Status: DC
Start: 2022-11-20 — End: 2023-07-28

## 2022-11-20 NOTE — Addendum Note (Signed)
Addended by: Arville Care on: 11/20/2022 09:49 AM   Modules accepted: Orders

## 2022-11-20 NOTE — Addendum Note (Signed)
Addended by: Dorene Sorrow on: 11/20/2022 10:16 AM   Modules accepted: Orders

## 2022-11-20 NOTE — Progress Notes (Addendum)
BP 124/83   Pulse 72   Ht 5\' 11"  (1.803 m)   Wt 230 lb (104.3 kg)   SpO2 96%   BMI 32.08 kg/m    Subjective:   [name], male    DOB: [birth year], 57 y.o.   MRN: [id number]  HPI: Adrian Jacobson is a 57 y.o. male presenting on 11/20/2022 for Medical Management of Chronic Issues and Diabetes   HPI Type 2 diabetes mellitus Patient comes in today for recheck of his diabetes. Patient has been currently taking metformin. Patient is currently on an ACE inhibitor/ARB. Patient has not seen an ophthalmologist this year. Patient denies any new issues with their feet. The symptom started onset as an adult hypertension and hyperlipidemia ARE RELATED TO DM   Hypertension Patient is currently on lisinopril, and their blood pressure today is 124/83. Patient denies any lightheadedness or dizziness. Patient denies headaches, blurred vision, chest pains, shortness of breath, or weakness. Denies any side effects from medication and is content with current medication.   Hyperlipidemia Patient is coming in for recheck of his hyperlipidemia. The patient is currently taking atorvastatin. They deny any issues with myalgias or history of liver damage from it. They deny any focal numbness or weakness or chest pain.   Relevant past medical, surgical, family and social history reviewed and updated as indicated. Interim medical history since our last visit reviewed. Allergies and medications reviewed and updated.  Review of Systems  Constitutional:  Negative for chills and fever.  Eyes:  Negative for visual disturbance.  Respiratory:  Negative for shortness of breath and wheezing.   Cardiovascular:  Negative for chest pain and leg swelling.  Musculoskeletal:  Negative for back pain and gait problem.  Skin:  Negative for rash.  Neurological:  Negative for dizziness, weakness and light-headedness.  All other systems reviewed and are negative.   Per HPI unless specifically indicated  above   Allergies as of 11/20/2022       Reactions   Strawberry Flavor Anaphylaxis   Anything with the fruit   Ozempic (0.25 Or 0.5 Mg-dose) [semaglutide(0.25 Or 0.5mg -dos)]    cramping   Hydrocodone Other (See Comments)   Mood swings    Niacin And Related Other (See Comments)   Feel like skin burning    Omeprazole Other (See Comments)   Abdominal pain        Medication List        Accurate as of November 20, 2022  9:48 AM. If you have any questions, ask your nurse or doctor.          STOP taking these medications    FreeStyle Libre 3 Sensor Misc Stopped by: Elige Radon Rosealyn Little       TAKE these medications    atorvastatin 20 MG tablet Commonly known as: LIPITOR Take 1 tablet (20 mg total) by mouth daily.   baclofen 10 MG tablet Commonly known as: LIORESAL TAKE 1 TABLET BY MOUTH THREE TIMES A DAY   dicyclomine 20 MG tablet Commonly known as: BENTYL One tablet po every 6 hours prn for abdominal pain   empagliflozin 25 MG Tabs tablet Commonly known as: Jardiance Take 1 tablet (25 mg total) by mouth daily before breakfast. Started by: Elige Radon Christyl Osentoski   fluticasone 50 MCG/ACT nasal spray Commonly known as: FLONASE Place 1 spray into both nostrils daily as needed for allergies or rhinitis.   lisinopril 5 MG tablet Commonly known as: ZESTRIL Take 1 tablet (5 mg total)  by mouth daily.   metFORMIN 500 MG 24 hr tablet Commonly known as: GLUCOPHAGE-XR Take 2 tablets (1,000 mg total) by mouth 2 (two) times daily. What changed: additional instructions Changed by: Elige Radon Jearl Soto   onetouch ultrasoft lancets Use as instructed   OneTouch Verio test strip Generic drug: glucose blood Test BS up to 6 times daily Dx E11.9         Objective:   BP 124/83   Pulse 72   Ht 5\' 11"  (1.803 m)   Wt 230 lb (104.3 kg)   SpO2 96%   BMI 32.08 kg/m   Wt Readings from Last 3 Encounters:  11/20/22 230 lb (104.3 kg)  10/18/21 228 lb (103.4 kg)  11/09/20 223  lb 6.4 oz (101.3 kg)    Physical Exam Vitals and nursing note reviewed.  Constitutional:      General: He is not in acute distress.    Appearance: He is well-developed. He is not diaphoretic.  Eyes:     General: No scleral icterus.    Conjunctiva/sclera: Conjunctivae normal.  Neck:     Thyroid: No thyromegaly.  Cardiovascular:     Rate and Rhythm: Normal rate and regular rhythm.     Heart sounds: Normal heart sounds. No murmur heard. Pulmonary:     Effort: Pulmonary effort is normal. No respiratory distress.     Breath sounds: Normal breath sounds. No wheezing.  Musculoskeletal:        General: No swelling. Normal range of motion.     Cervical back: Neck supple.  Lymphadenopathy:     Cervical: No cervical adenopathy.  Skin:    General: Skin is warm and dry.     Findings: No rash.  Neurological:     Mental Status: He is alert and oriented to person, place, and time.     Coordination: Coordination normal.  Psychiatric:        Behavior: Behavior normal.       Assessment & Plan:   Problem List Items Addressed This Visit       Cardiovascular and Mediastinum   Essential hypertension   Relevant Medications   atorvastatin (LIPITOR) 20 MG tablet   lisinopril (ZESTRIL) 5 MG tablet     Endocrine   Type 2 diabetes mellitus (HCC) - Primary   Relevant Medications   atorvastatin (LIPITOR) 20 MG tablet   lisinopril (ZESTRIL) 5 MG tablet   metFORMIN (GLUCOPHAGE-XR) 500 MG 24 hr tablet   glucose blood (ONETOUCH VERIO) test strip   Lancets (ONETOUCH ULTRASOFT) lancets   empagliflozin (JARDIANCE) 25 MG TABS tablet   Other Relevant Orders   CBC with Differential/Platelet   CMP14+EGFR   Lipid panel   Bayer DCA Hb A1c Waived   Microalbumin / creatinine urine ratio     Other   HLD (hyperlipidemia)   Relevant Medications   atorvastatin (LIPITOR) 20 MG tablet   lisinopril (ZESTRIL) 5 MG tablet    A1c is 10.5, will add farxiga to his regimen.  Focus on diet.  Patient said  he had stomach issues with Ozempic and stomach issues with [place] so we will try the [place].  Blood pressure looks good, no other changes Follow up plan: Return in about 3 months (around 02/20/2023), or if symptoms worsen or fail to improve, for Diabetes recheck.  Counseling provided for all of the vaccine components Orders Placed This Encounter  Procedures   CBC with Differential/Platelet   CMP14+EGFR   Lipid panel   Bayer DCA Hb A1c Waived  Microalbumin / creatinine urine ratio    Arville Care, MD [facility] 11/20/2022, 9:48 AM

## 2022-12-11 ENCOUNTER — Telehealth: Payer: Self-pay | Admitting: Family Medicine

## 2022-12-11 ENCOUNTER — Other Ambulatory Visit: Payer: Self-pay | Admitting: Family Medicine

## 2022-12-11 NOTE — Telephone Encounter (Signed)
Pts wife called stating that pt is trying to get a refill on his Meloxicam Rx but pharmacy says they dont have Rx on file. Wife says Dr Lovell Sheehan initially prescribed it to him after he had spine surgery about a year ago but Dr Dettinger was supposed to send in refills and hasn't.   Please advise.

## 2022-12-11 NOTE — Telephone Encounter (Signed)
Last office visit 11/20/22 Last refill 01/23/2022 #60, 3 refills

## 2022-12-12 MED ORDER — MELOXICAM 15 MG PO TABS: 15.0000 mg | ORAL_TABLET | Freq: Every day | ORAL | 2 refills | Status: AC

## 2022-12-12 NOTE — Telephone Encounter (Signed)
Pt has been made aware. 

## 2022-12-12 NOTE — Telephone Encounter (Signed)
Sent meloxicam to [facility] for him, just make sure that he always takes it with food so that it does not end up with ulcer in his stomach or anything like that.

## 2023-02-21 ENCOUNTER — Ambulatory Visit: Payer: 59 | Admitting: Family Medicine

## 2023-03-03 ENCOUNTER — Telehealth: Payer: Self-pay | Admitting: Family Medicine

## 2023-04-25 ENCOUNTER — Other Ambulatory Visit: Payer: Self-pay | Admitting: Family Medicine

## 2023-04-27 ENCOUNTER — Encounter: Payer: Self-pay | Admitting: Family Medicine

## 2023-04-27 DIAGNOSIS — E1169 Type 2 diabetes mellitus with other specified complication: Secondary | ICD-10-CM

## 2023-04-27 MED ORDER — METFORMIN HCL ER 500 MG PO TB24
1000.0000 mg | ORAL_TABLET | Freq: Two times a day (BID) | ORAL | 0 refills | Status: DC
Start: 2023-04-27 — End: 2023-07-25

## 2023-04-28 MED ORDER — METFORMIN HCL ER 500 MG PO TB24: 1000.0000 mg | ORAL_TABLET | Freq: Two times a day (BID) | ORAL | 0 refills | Status: DC

## 2023-06-29 ENCOUNTER — Other Ambulatory Visit: Payer: Self-pay | Admitting: Family Medicine

## 2023-06-29 DIAGNOSIS — I10 Essential (primary) hypertension: Secondary | ICD-10-CM

## 2023-07-14 ENCOUNTER — Encounter: Payer: Self-pay | Admitting: Family Medicine

## 2023-07-14 ENCOUNTER — Other Ambulatory Visit: Payer: Self-pay | Admitting: Family Medicine

## 2023-07-14 DIAGNOSIS — I10 Essential (primary) hypertension: Secondary | ICD-10-CM

## 2023-07-14 NOTE — Telephone Encounter (Signed)
 LMTCB to schedule appt Letter mailed

## 2023-07-14 NOTE — Telephone Encounter (Signed)
 Dettinger pt NTBS 30-d given 06/30/23

## 2023-07-25 ENCOUNTER — Other Ambulatory Visit: Payer: Self-pay | Admitting: Family Medicine

## 2023-07-25 DIAGNOSIS — E1169 Type 2 diabetes mellitus with other specified complication: Secondary | ICD-10-CM

## 2023-07-25 DIAGNOSIS — I10 Essential (primary) hypertension: Secondary | ICD-10-CM

## 2023-07-28 ENCOUNTER — Ambulatory Visit (INDEPENDENT_AMBULATORY_CARE_PROVIDER_SITE_OTHER): Admitting: Family Medicine

## 2023-07-28 ENCOUNTER — Other Ambulatory Visit: Payer: Self-pay | Admitting: Family Medicine

## 2023-07-28 VITALS — BP 122/77 | HR 84 | Ht 71.0 in | Wt 227.0 lb

## 2023-07-28 DIAGNOSIS — E782 Mixed hyperlipidemia: Secondary | ICD-10-CM

## 2023-07-28 DIAGNOSIS — E1169 Type 2 diabetes mellitus with other specified complication: Secondary | ICD-10-CM

## 2023-07-28 DIAGNOSIS — I10 Essential (primary) hypertension: Secondary | ICD-10-CM

## 2023-07-28 DIAGNOSIS — Z7984 Long term (current) use of oral hypoglycemic drugs: Secondary | ICD-10-CM

## 2023-07-28 DIAGNOSIS — Z23 Encounter for immunization: Secondary | ICD-10-CM

## 2023-07-28 DIAGNOSIS — L239 Allergic contact dermatitis, unspecified cause: Secondary | ICD-10-CM | POA: Diagnosis not present

## 2023-07-28 LAB — BAYER DCA HB A1C WAIVED: HB A1C (BAYER DCA - WAIVED): 10.1 % — ABNORMAL HIGH (ref 4.8–5.6)

## 2023-07-28 MED ORDER — LISINOPRIL 5 MG PO TABS: 5.0000 mg | ORAL_TABLET | Freq: Every day | ORAL | 3 refills | Status: AC

## 2023-07-28 MED ORDER — DAPAGLIFLOZIN PROPANEDIOL 10 MG PO TABS: 10.0000 mg | ORAL_TABLET | Freq: Every day | ORAL | 1 refills | Status: DC

## 2023-07-28 MED ORDER — METFORMIN HCL ER 500 MG PO TB24: 1000.0000 mg | ORAL_TABLET | Freq: Two times a day (BID) | ORAL | 3 refills | Status: AC

## 2023-07-28 MED ORDER — ATORVASTATIN CALCIUM 20 MG PO TABS: 20.0000 mg | ORAL_TABLET | Freq: Every day | ORAL | 3 refills | Status: AC

## 2023-07-28 MED ORDER — TRIAMCINOLONE ACETONIDE 0.1 % EX CREA: 1.0000 | TOPICAL_CREAM | Freq: Two times a day (BID) | CUTANEOUS | 0 refills | Status: AC

## 2023-07-28 NOTE — Progress Notes (Signed)
 BP 122/77   Pulse 84   Ht 5\' 11"  (1.803 m)   Wt 227 lb (103 kg)   SpO2 97%   BMI 31.66 kg/m    Subjective:   [name], male    DOB: [birth year], 58 y.o.   MRN: [id number]  HPI: Adrian Jacobson is a 58 y.o. male presenting on 07/28/2023 for Medical Management of Chronic Issues, Diabetes, Hyperlipidemia, and Hypertension   HPI Type 2 diabetes mellitus Patient comes in today for recheck of his diabetes. Patient has been currently taking metformin, never tried the [place]. Patient is currently on an ACE inhibitor/ARB. Patient has not seen an ophthalmologist this year. Patient denies any new issues with their feet. The symptom started onset as an adult hypertension and hyperlipidemia ARE RELATED TO DM   Hypertension Patient is currently on lisinopril, and their blood pressure today is 122/77. Patient denies any lightheadedness or dizziness. Patient denies headaches, blurred vision, chest pains, shortness of breath, or weakness. Denies any side effects from medication and is content with current medication.   Hyperlipidemia Patient is coming in for recheck of his hyperlipidemia. The patient is currently taking atorvastatin. They deny any issues with myalgias or history of liver damage from it. They deny any focal numbness or weakness or chest pain.   Patient has rash that started up on his arms and lower abdomen and some in his groin.  He was working outside and he thinks he got into something and started in the very itchy rash.  He thinks he got it on his groin when he was going to the restroom afterwards.  He says the groin parts cleared but he still has a lot of itching on his arms.  He is taking Benadryl.  Relevant past medical, surgical, family and social history reviewed and updated as indicated. Interim medical history since our last visit reviewed. Allergies and medications reviewed and updated.  Review of Systems  Constitutional:  Negative for chills and fever.   Eyes:  Negative for visual disturbance.  Respiratory:  Negative for shortness of breath and wheezing.   Cardiovascular:  Negative for chest pain and leg swelling.  Musculoskeletal:  Negative for back pain and gait problem.  Skin:  Positive for rash.  Neurological:  Negative for dizziness and numbness.  All other systems reviewed and are negative.   Per HPI unless specifically indicated above   Allergies as of 07/28/2023       Reactions   Strawberry Flavoring Agent (non-screening) Anaphylaxis   Anything with the fruit   Ozempic (0.25 Or 0.5 Mg-dose) [semaglutide(0.25 Or 0.5mg -dos)]    cramping   Hydrocodone Other (See Comments)   Mood swings    Niacin And Related Other (See Comments)   Feel like skin burning    Omeprazole Other (See Comments)   Abdominal pain        Medication List        Accurate as of July 28, 2023  2:23 PM. If you have any questions, ask your nurse or doctor.          atorvastatin 20 MG tablet Commonly known as: LIPITOR Take 1 tablet (20 mg total) by mouth daily.   baclofen 10 MG tablet Commonly known as: LIORESAL TAKE 1 TABLET BY MOUTH THREE TIMES A DAY   dapagliflozin propanediol 10 MG Tabs tablet Commonly known as: Farxiga Take 1 tablet (10 mg total) by mouth daily before breakfast.   dicyclomine 20 MG tablet Commonly known as:  BENTYL One tablet po every 6 hours prn for abdominal pain   fluticasone 50 MCG/ACT nasal spray Commonly known as: FLONASE Place 1 spray into both nostrils daily as needed for allergies or rhinitis.   lisinopril 5 MG tablet Commonly known as: ZESTRIL Take 1 tablet (5 mg total) by mouth daily. What changed: additional instructions Changed by: Elige Radon Lash Matulich   meloxicam 15 MG tablet Commonly known as: MOBIC Take 1 tablet (15 mg total) by mouth daily.   metFORMIN 500 MG 24 hr tablet Commonly known as: GLUCOPHAGE-XR Take 2 tablets (1,000 mg total) by mouth 2 (two) times daily with a meal. What  changed: Another medication with the same name was removed. Continue taking this medication, and follow the directions you see here. Changed by: Elige Radon Lucian Baswell   onetouch ultrasoft lancets Use as instructed   OneTouch Verio test strip Generic drug: glucose blood Test BS up to 6 times daily Dx E11.9   triamcinolone cream 0.1 % Commonly known as: KENALOG Apply 1 Application topically 2 (two) times daily for 14 days. Started by: Elige Radon Prayan Ulin         Objective:   BP 122/77   Pulse 84   Ht 5\' 11"  (1.803 m)   Wt 227 lb (103 kg)   SpO2 97%   BMI 31.66 kg/m   Wt Readings from Last 3 Encounters:  07/28/23 227 lb (103 kg)  11/20/22 230 lb (104.3 kg)  10/18/21 228 lb (103.4 kg)    Physical Exam Vitals and nursing note reviewed.  Constitutional:      General: He is not in acute distress.    Appearance: He is well-developed. He is not diaphoretic.  Eyes:     General: No scleral icterus.    Conjunctiva/sclera: Conjunctivae normal.  Neck:     Thyroid: No thyromegaly.  Cardiovascular:     Rate and Rhythm: Normal rate and regular rhythm.     Heart sounds: Normal heart sounds. No murmur heard. Pulmonary:     Effort: Pulmonary effort is normal. No respiratory distress.     Breath sounds: Normal breath sounds. No wheezing.  Musculoskeletal:        General: No swelling.     Cervical back: Neck supple.  Lymphadenopathy:     Cervical: No cervical adenopathy.  Skin:    General: Skin is warm and dry.     Findings: Rash (Fine pink papules scattered on both arms) present.  Neurological:     Mental Status: He is alert and oriented to person, place, and time.     Coordination: Coordination normal.  Psychiatric:        Behavior: Behavior normal.       Assessment & Plan:   Problem List Items Addressed This Visit       Cardiovascular and Mediastinum   Essential hypertension   Relevant Medications   lisinopril (ZESTRIL) 5 MG tablet   atorvastatin (LIPITOR) 20 MG  tablet   Other Relevant Orders   Bayer DCA Hb A1c Waived   CBC with Differential/Platelet   CMP14+EGFR   Lipid panel   PSA, total and free     Endocrine   Type 2 diabetes mellitus (HCC) - Primary   Relevant Medications   metFORMIN (GLUCOPHAGE-XR) 500 MG 24 hr tablet   lisinopril (ZESTRIL) 5 MG tablet   dapagliflozin propanediol (FARXIGA) 10 MG TABS tablet   atorvastatin (LIPITOR) 20 MG tablet   Other Relevant Orders   Bayer DCA Hb A1c Waived  CBC with Differential/Platelet   CMP14+EGFR   Lipid panel   PSA, total and free     Other   HLD (hyperlipidemia)   Relevant Medications   lisinopril (ZESTRIL) 5 MG tablet   atorvastatin (LIPITOR) 20 MG tablet   Other Relevant Orders   Bayer DCA Hb A1c Waived   CBC with Differential/Platelet   CMP14+EGFR   Lipid panel   PSA, total and free   Other Visit Diagnoses       Allergic contact dermatitis, unspecified trigger       Relevant Medications   triamcinolone cream (KENALOG) 0.1 %       Will treat contact dermatitis by increasing Benadryl at home and given triamcinolone.  Blood sugars elevated and A1c of 10.1.  He will try the [place] but encouraged him to focus on diet as well significantly. Follow up plan: Return in about 3 months (around 10/27/2023), or if symptoms worsen or fail to improve, for Diabetes.  Counseling provided for all of the vaccine components Orders Placed This Encounter  Procedures   Bayer DCA Hb A1c Waived   CBC with Differential/Platelet   CMP14+EGFR   Lipid panel   PSA, total and free    Arville Care, MD [facility] 07/28/2023, 2:23 PM

## 2023-07-29 LAB — CBC WITH DIFFERENTIAL/PLATELET
Basophils Absolute: 0.1 10*3/uL (ref 0.0–0.2)
Basos: 2 %
EOS (ABSOLUTE): 1.3 10*3/uL — ABNORMAL HIGH (ref 0.0–0.4)
Eos: 22 %
Hematocrit: 41.9 % (ref 37.5–51.0)
Hemoglobin: 14.1 g/dL (ref 13.0–17.7)
Immature Grans (Abs): 0 10*3/uL (ref 0.0–0.1)
Immature Granulocytes: 0 %
Lymphocytes Absolute: 2 10*3/uL (ref 0.7–3.1)
Lymphs: 33 %
MCH: 28.5 pg (ref 26.6–33.0)
MCHC: 33.7 g/dL (ref 31.5–35.7)
MCV: 85 fL (ref 79–97)
Monocytes Absolute: 0.5 10*3/uL (ref 0.1–0.9)
Monocytes: 8 %
Neutrophils Absolute: 2.2 10*3/uL (ref 1.4–7.0)
Neutrophils: 35 %
Platelets: 212 10*3/uL (ref 150–450)
RBC: 4.94 x10E6/uL (ref 4.14–5.80)
RDW: 13.2 % (ref 11.6–15.4)
WBC: 6.2 10*3/uL (ref 3.4–10.8)

## 2023-07-29 LAB — CMP14+EGFR
ALT: 38 IU/L (ref 0–44)
AST: 27 IU/L (ref 0–40)
Albumin: 4.5 g/dL (ref 3.8–4.9)
Alkaline Phosphatase: 69 IU/L (ref 44–121)
BUN/Creatinine Ratio: 14 (ref 9–20)
BUN: 17 mg/dL (ref 6–24)
Bilirubin Total: 0.3 mg/dL (ref 0.0–1.2)
CO2: 22 mmol/L (ref 20–29)
Calcium: 9.6 mg/dL (ref 8.7–10.2)
Chloride: 103 mmol/L (ref 96–106)
Creatinine, Ser: 1.18 mg/dL (ref 0.76–1.27)
Globulin, Total: 2.6 g/dL (ref 1.5–4.5)
Glucose: 244 mg/dL — ABNORMAL HIGH (ref 70–99)
Potassium: 4.9 mmol/L (ref 3.5–5.2)
Sodium: 138 mmol/L (ref 134–144)
Total Protein: 7.1 g/dL (ref 6.0–8.5)
eGFR: 73 mL/min/{1.73_m2} (ref >59)

## 2023-07-29 LAB — LIPID PANEL
Chol/HDL Ratio: 4.7 ratio (ref 0.0–5.0)
Cholesterol, Total: 154 mg/dL (ref 100–199)
HDL: 33 mg/dL — ABNORMAL LOW (ref >39)
LDL Chol Calc (NIH): 83 mg/dL (ref 0–99)
Triglycerides: 223 mg/dL — ABNORMAL HIGH (ref 0–149)
VLDL Cholesterol Cal: 38 mg/dL (ref 5–40)

## 2023-07-29 LAB — PSA, TOTAL AND FREE
PSA, Free Pct: 20 %
PSA, Free: 0.08 ng/mL
Prostate Specific Ag, Serum: 0.4 ng/mL (ref 0.0–4.0)

## 2023-08-01 ENCOUNTER — Telehealth: Payer: Self-pay

## 2023-08-01 ENCOUNTER — Encounter: Payer: Self-pay | Admitting: Family Medicine

## 2023-08-01 NOTE — Telephone Encounter (Signed)
 Copied from CRM [id number]. Topic: Clinical - Lab/Test Results >> Aug 01, 2023  3:36 PM Elle L wrote: Reason for CRM: The patient was returning a call regarding his lab work. I read the note to him verbatim and he expressed understanding.

## 2023-08-01 NOTE — Telephone Encounter (Signed)
 Noted.

## 2023-09-10 ENCOUNTER — Telehealth: Payer: Self-pay | Admitting: Family Medicine

## 2023-10-01 ENCOUNTER — Telehealth: Payer: Self-pay | Admitting: Family Medicine

## 2023-10-27 ENCOUNTER — Ambulatory Visit: Admitting: Family Medicine

## 2023-10-31 ENCOUNTER — Telehealth: Payer: Self-pay | Admitting: Family Medicine

## 2023-10-31 NOTE — Telephone Encounter (Signed)
 Left message on voicemail to call and make appt for Diabetic eye exam.

## 2023-11-13 ENCOUNTER — Other Ambulatory Visit: Payer: Self-pay | Admitting: Family Medicine

## 2023-11-28 ENCOUNTER — Encounter: Payer: Self-pay | Admitting: Family Medicine

## 2023-11-28 ENCOUNTER — Ambulatory Visit: Admitting: Family Medicine

## 2023-11-28 VITALS — BP 109/74 | HR 83 | Ht 71.0 in | Wt 219.0 lb

## 2023-11-28 DIAGNOSIS — I10 Essential (primary) hypertension: Secondary | ICD-10-CM | POA: Diagnosis not present

## 2023-11-28 DIAGNOSIS — E782 Mixed hyperlipidemia: Secondary | ICD-10-CM | POA: Diagnosis not present

## 2023-11-28 DIAGNOSIS — Z7984 Long term (current) use of oral hypoglycemic drugs: Secondary | ICD-10-CM

## 2023-11-28 DIAGNOSIS — E1169 Type 2 diabetes mellitus with other specified complication: Secondary | ICD-10-CM | POA: Diagnosis not present

## 2023-11-28 LAB — BAYER DCA HB A1C WAIVED: HB A1C (BAYER DCA - WAIVED): 8.4 % — ABNORMAL HIGH (ref 4.8–5.6)

## 2023-11-28 MED ORDER — DAPAGLIFLOZIN PROPANEDIOL 10 MG PO TABS: 10.0000 mg | ORAL_TABLET | Freq: Every day | ORAL | 1 refills | Status: AC

## 2023-11-28 NOTE — Progress Notes (Signed)
 BP 109/74   Pulse 83   Ht 5' 11 (1.803 m)   Wt 219 lb (99.3 kg)   SpO2 96%   BMI 30.54 kg/m    Subjective:   [name], male    DOB: 08/25/1965, 58 y.o.   MRN: [id number]  HPI: Adrian Jacobson is a 58 y.o. male presenting on 11/28/2023 for Medical Management of Chronic Issues and Diabetes   Discussed the use of AI scribe software for clinical note transcription with the patient, who gave verbal consent to proceed.  History of Present Illness   Adrian Jacobson is a 58 year old male with diabetes, hypertension, and hyperlipidemia who presents for a recheck of these conditions.  His blood sugar levels have been higher recently, averaging around 180-190 mg/dL, typically measured in the mornings before eating. He checks his blood sugar infrequently, about once a week, but when he does, he tends to check it multiple times a day. He is currently taking metformin , two tablets in the morning and two in the evening, and Farxiga , one tablet in the morning, without missing doses.  His blood pressure has been on the lower side, with a recent reading of 109/74 mmHg. He takes lisinopril  5 mg daily. He experienced an episode of dizziness and stumbling about a week ago after being outside in hot weather, which he attributes to dehydration. He felt better after hydrating with a liquid IV. No regular episodes of dizziness or lightheadedness.  For his hyperlipidemia, he takes atorvastatin  twice a week, on Wednesdays and Sundays, and reports no muscle aches or problems. He is curious about some skin discoloration on his legs, which he associates with swelling, but he has not had significant swelling recently. He recalls a past knee issue from 30 years ago, for which surgery was suggested but not pursued. He wonders if the discoloration could be related to this past issue.          Relevant past medical, surgical, family and social history reviewed and updated as indicated. Interim  medical history since our last visit reviewed. Allergies and medications reviewed and updated.  Review of Systems  Constitutional:  Negative for chills and fever.  Eyes:  Negative for visual disturbance.  Respiratory:  Negative for shortness of breath and wheezing.   Cardiovascular:  Negative for chest pain and leg swelling.  Musculoskeletal:  Negative for back pain and gait problem.  Skin:  Negative for rash.  Neurological:  Negative for dizziness, light-headedness and numbness.  All other systems reviewed and are negative.   Per HPI unless specifically indicated above   Allergies as of 11/28/2023       Reactions   Strawberry Flavoring Agent (non-screening) Anaphylaxis   Anything with the fruit   Ozempic  (0.25 Or 0.5 Mg-dose) [semaglutide (0.25 Or 0.5mg -dos)]    cramping   Hydrocodone Other (See Comments)   Mood swings    Niacin And Related Other (See Comments)   Feel like skin burning    Omeprazole Other (See Comments)   Abdominal pain        Medication List        Accurate as of November 28, 2023  2:48 PM. If you have any questions, ask your nurse or doctor.          atorvastatin  20 MG tablet Commonly known as: LIPITOR Take 1 tablet (20 mg total) by mouth daily.   baclofen  10 MG tablet Commonly known as: LIORESAL  TAKE 1 TABLET BY MOUTH THREE  TIMES A DAY   dapagliflozin  propanediol 10 MG Tabs tablet Commonly known as: Farxiga  Take 1 tablet (10 mg total) by mouth daily before breakfast.   dicyclomine  20 MG tablet Commonly known as: BENTYL  One tablet po every 6 hours prn for abdominal pain   fluticasone  50 MCG/ACT nasal spray Commonly known as: FLONASE  Place 1 spray into both nostrils daily as needed for allergies or rhinitis.   lisinopril  5 MG tablet Commonly known as: ZESTRIL  Take 1 tablet (5 mg total) by mouth daily.   meloxicam  15 MG tablet Commonly known as: MOBIC  Take 1 tablet (15 mg total) by mouth daily.   metFORMIN  500 MG 24 hr  tablet Commonly known as: GLUCOPHAGE -XR Take 2 tablets (1,000 mg total) by mouth 2 (two) times daily with a meal.   onetouch ultrasoft lancets Use as instructed   OneTouch Verio test strip Generic drug: glucose blood Test BS up to 6 times daily Dx E11.9         Objective:   BP 109/74   Pulse 83   Ht 5' 11 (1.803 m)   Wt 219 lb (99.3 kg)   SpO2 96%   BMI 30.54 kg/m   Wt Readings from Last 3 Encounters:  11/28/23 219 lb (99.3 kg)  07/28/23 227 lb (103 kg)  11/20/22 230 lb (104.3 kg)    Physical Exam Physical Exam   VITALS: BP- 109/74 CARDIOVASCULAR: regular rate and rhythm no murmur. Pulses palpable in feet bilaterally. Lungs: clear to auscultation bilateral Thyroid: no palpable nodules or masses. EXTREMITIES: No cuts or sores on feet bilaterally. NEUROLOGICAL: Sensation intact in feet bilaterally.       Diabetic Foot Exam - Simple   Simple Foot Form Diabetic Foot exam was performed with the following findings: Yes 11/28/2023  2:44 PM  Visual Inspection No deformities, no ulcerations, no other skin breakdown bilaterally: Yes Sensation Testing Intact to touch and monofilament testing bilaterally: Yes Pulse Check Posterior Tibialis and Dorsalis pulse intact bilaterally: Yes Comments      Assessment & Plan:   Problem List Items Addressed This Visit       Cardiovascular and Mediastinum   Essential hypertension     Endocrine   Type 2 diabetes mellitus (HCC) - Primary   Relevant Medications   dapagliflozin  propanediol (FARXIGA ) 10 MG TABS tablet   Other Relevant Orders   Bayer DCA Hb A1c Waived   Microalbumin/Creatinine Ratio, Urine     Other   HLD (hyperlipidemia)       Type 2 diabetes mellitus Blood glucose levels averaging 180-190 mg/dL. A1c previously 10, goal to reduce below 9. No medication adherence issues. - Continue metformin , 2 tablets in the morning and 2 in the evening. - Continue Farxiga , 1 tablet in the morning. - Monitor A1c  levels. - A1c is 8.4 which is much improved.  He made it halfway and he has been losing weight, he wants to stick with his current medicine and keep focusing on diet and do 3 more months.  Hypertension Well-controlled with lisinopril . Recent BP 109/74 mmHg. Dizziness likely due to dehydration from overexertion in hot weather. - Continue lisinopril , 5 mg daily. - Advise on maintaining hydration, especially in hot weather.  Hyperlipidemia Managed with atorvastatin , taken twice weekly. - Continue atorvastatin  on Wednesdays and Sundays. - Monitor cholesterol levels.          Follow up plan: Return in about 3 months (around 02/28/2024), or if symptoms worsen or fail to improve, for Diabetes recheck.  Counseling  provided for all of the vaccine components Orders Placed This Encounter  Procedures   Bayer DCA Hb A1c Waived   Microalbumin/Creatinine Ratio, Urine    Fonda Levins, MD [facility] 11/28/2023, 2:48 PM

## 2023-11-30 LAB — MICROALBUMIN / CREATININE URINE RATIO
Creatinine, Urine: 61.8 mg/dL
Microalb/Creat Ratio: 6 mg/g{creat} (ref 0–29)
Microalbumin, Urine: 3.7 ug/mL

## 2023-12-01 ENCOUNTER — Ambulatory Visit: Payer: Self-pay | Admitting: Family Medicine

## 2024-04-24 ENCOUNTER — Other Ambulatory Visit: Payer: Self-pay | Admitting: Family Medicine
